# Patient Record
Sex: Male | Born: 1950
Health system: Southern US, Community
[De-identification: ages and names within clinical notes are randomized; demographics above are authoritative.]

## PROBLEM LIST (undated history)

## (undated) DIAGNOSIS — N183 Chronic kidney disease, stage 3 unspecified: Secondary | ICD-10-CM

## (undated) DIAGNOSIS — Z87442 Personal history of urinary calculi: Secondary | ICD-10-CM

## (undated) DIAGNOSIS — K219 Gastro-esophageal reflux disease without esophagitis: Secondary | ICD-10-CM

## (undated) DIAGNOSIS — L97509 Non-pressure chronic ulcer of other part of unspecified foot with unspecified severity: Secondary | ICD-10-CM

## (undated) DIAGNOSIS — N281 Cyst of kidney, acquired: Secondary | ICD-10-CM

## (undated) DIAGNOSIS — E119 Type 2 diabetes mellitus without complications: Secondary | ICD-10-CM

## (undated) DIAGNOSIS — N200 Calculus of kidney: Secondary | ICD-10-CM

## (undated) DIAGNOSIS — I10 Essential (primary) hypertension: Secondary | ICD-10-CM

## (undated) HISTORY — PX: APPENDECTOMY: SHX54

## (undated) HISTORY — PX: COLONOSCOPY: SHX174

---

## 2014-02-21 DIAGNOSIS — Z23 Encounter for immunization: Secondary | ICD-10-CM | POA: Diagnosis not present

## 2014-04-01 DIAGNOSIS — E1122 Type 2 diabetes mellitus with diabetic chronic kidney disease: Secondary | ICD-10-CM | POA: Diagnosis not present

## 2014-04-01 DIAGNOSIS — N183 Chronic kidney disease, stage 3 (moderate): Secondary | ICD-10-CM | POA: Diagnosis not present

## 2014-04-04 DIAGNOSIS — I129 Hypertensive chronic kidney disease with stage 1 through stage 4 chronic kidney disease, or unspecified chronic kidney disease: Secondary | ICD-10-CM | POA: Diagnosis not present

## 2014-04-04 DIAGNOSIS — N183 Chronic kidney disease, stage 3 (moderate): Secondary | ICD-10-CM | POA: Diagnosis not present

## 2014-04-04 DIAGNOSIS — E1122 Type 2 diabetes mellitus with diabetic chronic kidney disease: Secondary | ICD-10-CM | POA: Diagnosis not present

## 2014-04-11 DIAGNOSIS — E1121 Type 2 diabetes mellitus with diabetic nephropathy: Secondary | ICD-10-CM | POA: Diagnosis not present

## 2014-04-11 DIAGNOSIS — I10 Essential (primary) hypertension: Secondary | ICD-10-CM | POA: Diagnosis not present

## 2014-04-11 DIAGNOSIS — E785 Hyperlipidemia, unspecified: Secondary | ICD-10-CM | POA: Diagnosis not present

## 2014-04-11 DIAGNOSIS — N182 Chronic kidney disease, stage 2 (mild): Secondary | ICD-10-CM | POA: Diagnosis not present

## 2014-08-07 DIAGNOSIS — E1121 Type 2 diabetes mellitus with diabetic nephropathy: Secondary | ICD-10-CM | POA: Diagnosis not present

## 2014-08-07 DIAGNOSIS — I10 Essential (primary) hypertension: Secondary | ICD-10-CM | POA: Diagnosis not present

## 2014-08-07 DIAGNOSIS — R682 Dry mouth, unspecified: Secondary | ICD-10-CM | POA: Diagnosis not present

## 2014-11-12 DIAGNOSIS — N183 Chronic kidney disease, stage 3 (moderate): Secondary | ICD-10-CM | POA: Diagnosis not present

## 2014-11-14 DIAGNOSIS — N183 Chronic kidney disease, stage 3 (moderate): Secondary | ICD-10-CM | POA: Diagnosis not present

## 2014-11-14 DIAGNOSIS — I129 Hypertensive chronic kidney disease with stage 1 through stage 4 chronic kidney disease, or unspecified chronic kidney disease: Secondary | ICD-10-CM | POA: Diagnosis not present

## 2014-11-14 DIAGNOSIS — E1122 Type 2 diabetes mellitus with diabetic chronic kidney disease: Secondary | ICD-10-CM | POA: Diagnosis not present

## 2015-01-12 DIAGNOSIS — Z23 Encounter for immunization: Secondary | ICD-10-CM | POA: Diagnosis not present

## 2015-05-26 DIAGNOSIS — E1121 Type 2 diabetes mellitus with diabetic nephropathy: Secondary | ICD-10-CM | POA: Diagnosis not present

## 2015-05-26 DIAGNOSIS — E7800 Pure hypercholesterolemia, unspecified: Secondary | ICD-10-CM | POA: Diagnosis not present

## 2015-05-26 DIAGNOSIS — Z79899 Other long term (current) drug therapy: Secondary | ICD-10-CM | POA: Diagnosis not present

## 2015-05-26 DIAGNOSIS — Z7984 Long term (current) use of oral hypoglycemic drugs: Secondary | ICD-10-CM | POA: Diagnosis not present

## 2015-05-29 ENCOUNTER — Other Ambulatory Visit: Payer: Self-pay | Admitting: Family Medicine

## 2015-05-29 DIAGNOSIS — E7800 Pure hypercholesterolemia, unspecified: Secondary | ICD-10-CM | POA: Diagnosis not present

## 2015-05-29 DIAGNOSIS — Z23 Encounter for immunization: Secondary | ICD-10-CM | POA: Diagnosis not present

## 2015-05-29 DIAGNOSIS — Z136 Encounter for screening for cardiovascular disorders: Secondary | ICD-10-CM

## 2015-05-29 DIAGNOSIS — N182 Chronic kidney disease, stage 2 (mild): Secondary | ICD-10-CM | POA: Diagnosis not present

## 2015-05-29 DIAGNOSIS — I10 Essential (primary) hypertension: Secondary | ICD-10-CM | POA: Diagnosis not present

## 2015-05-29 DIAGNOSIS — E1121 Type 2 diabetes mellitus with diabetic nephropathy: Secondary | ICD-10-CM | POA: Diagnosis not present

## 2015-05-29 DIAGNOSIS — Z0001 Encounter for general adult medical examination with abnormal findings: Secondary | ICD-10-CM | POA: Diagnosis not present

## 2015-06-09 DIAGNOSIS — I129 Hypertensive chronic kidney disease with stage 1 through stage 4 chronic kidney disease, or unspecified chronic kidney disease: Secondary | ICD-10-CM | POA: Diagnosis not present

## 2015-06-09 DIAGNOSIS — N183 Chronic kidney disease, stage 3 (moderate): Secondary | ICD-10-CM | POA: Diagnosis not present

## 2015-06-12 DIAGNOSIS — E1122 Type 2 diabetes mellitus with diabetic chronic kidney disease: Secondary | ICD-10-CM | POA: Diagnosis not present

## 2015-06-12 DIAGNOSIS — I129 Hypertensive chronic kidney disease with stage 1 through stage 4 chronic kidney disease, or unspecified chronic kidney disease: Secondary | ICD-10-CM | POA: Diagnosis not present

## 2015-06-12 DIAGNOSIS — N183 Chronic kidney disease, stage 3 (moderate): Secondary | ICD-10-CM | POA: Diagnosis not present

## 2015-09-18 DIAGNOSIS — D1801 Hemangioma of skin and subcutaneous tissue: Secondary | ICD-10-CM | POA: Diagnosis not present

## 2015-09-18 DIAGNOSIS — D485 Neoplasm of uncertain behavior of skin: Secondary | ICD-10-CM | POA: Diagnosis not present

## 2015-09-18 DIAGNOSIS — L309 Dermatitis, unspecified: Secondary | ICD-10-CM | POA: Diagnosis not present

## 2015-09-18 DIAGNOSIS — D2371 Other benign neoplasm of skin of right lower limb, including hip: Secondary | ICD-10-CM | POA: Diagnosis not present

## 2015-09-18 DIAGNOSIS — D2262 Melanocytic nevi of left upper limb, including shoulder: Secondary | ICD-10-CM | POA: Diagnosis not present

## 2015-09-18 DIAGNOSIS — L7211 Pilar cyst: Secondary | ICD-10-CM | POA: Diagnosis not present

## 2015-09-18 DIAGNOSIS — D225 Melanocytic nevi of trunk: Secondary | ICD-10-CM | POA: Diagnosis not present

## 2015-09-18 DIAGNOSIS — L821 Other seborrheic keratosis: Secondary | ICD-10-CM | POA: Diagnosis not present

## 2015-09-18 DIAGNOSIS — D2271 Melanocytic nevi of right lower limb, including hip: Secondary | ICD-10-CM | POA: Diagnosis not present

## 2015-09-18 DIAGNOSIS — D2261 Melanocytic nevi of right upper limb, including shoulder: Secondary | ICD-10-CM | POA: Diagnosis not present

## 2015-11-06 DIAGNOSIS — Z23 Encounter for immunization: Secondary | ICD-10-CM | POA: Diagnosis not present

## 2015-12-04 DIAGNOSIS — N183 Chronic kidney disease, stage 3 (moderate): Secondary | ICD-10-CM | POA: Diagnosis not present

## 2015-12-11 DIAGNOSIS — E1122 Type 2 diabetes mellitus with diabetic chronic kidney disease: Secondary | ICD-10-CM | POA: Diagnosis not present

## 2015-12-11 DIAGNOSIS — N183 Chronic kidney disease, stage 3 (moderate): Secondary | ICD-10-CM | POA: Diagnosis not present

## 2015-12-11 DIAGNOSIS — I129 Hypertensive chronic kidney disease with stage 1 through stage 4 chronic kidney disease, or unspecified chronic kidney disease: Secondary | ICD-10-CM | POA: Diagnosis not present

## 2016-01-29 DIAGNOSIS — N182 Chronic kidney disease, stage 2 (mild): Secondary | ICD-10-CM | POA: Diagnosis not present

## 2016-01-29 DIAGNOSIS — I10 Essential (primary) hypertension: Secondary | ICD-10-CM | POA: Diagnosis not present

## 2016-01-29 DIAGNOSIS — E7800 Pure hypercholesterolemia, unspecified: Secondary | ICD-10-CM | POA: Diagnosis not present

## 2016-01-29 DIAGNOSIS — E1121 Type 2 diabetes mellitus with diabetic nephropathy: Secondary | ICD-10-CM | POA: Diagnosis not present

## 2016-04-26 DIAGNOSIS — Z7984 Long term (current) use of oral hypoglycemic drugs: Secondary | ICD-10-CM | POA: Diagnosis not present

## 2016-04-26 DIAGNOSIS — E1121 Type 2 diabetes mellitus with diabetic nephropathy: Secondary | ICD-10-CM | POA: Diagnosis not present

## 2016-05-27 DIAGNOSIS — N183 Chronic kidney disease, stage 3 (moderate): Secondary | ICD-10-CM | POA: Diagnosis not present

## 2016-06-10 DIAGNOSIS — I129 Hypertensive chronic kidney disease with stage 1 through stage 4 chronic kidney disease, or unspecified chronic kidney disease: Secondary | ICD-10-CM | POA: Diagnosis not present

## 2016-06-10 DIAGNOSIS — N183 Chronic kidney disease, stage 3 (moderate): Secondary | ICD-10-CM | POA: Diagnosis not present

## 2016-06-10 DIAGNOSIS — E1122 Type 2 diabetes mellitus with diabetic chronic kidney disease: Secondary | ICD-10-CM | POA: Diagnosis not present

## 2016-09-22 ENCOUNTER — Emergency Department (HOSPITAL_COMMUNITY): Payer: Medicare Other

## 2016-09-22 ENCOUNTER — Emergency Department (HOSPITAL_COMMUNITY)
Admission: EM | Admit: 2016-09-22 | Discharge: 2016-09-22 | Disposition: A | Discharge status: Home / Self Care | Payer: Medicare Other | Attending: Emergency Medicine | Admitting: Emergency Medicine

## 2016-09-22 ENCOUNTER — Encounter (HOSPITAL_COMMUNITY): Payer: Self-pay

## 2016-09-22 DIAGNOSIS — N200 Calculus of kidney: Secondary | ICD-10-CM | POA: Insufficient documentation

## 2016-09-22 DIAGNOSIS — E119 Type 2 diabetes mellitus without complications: Secondary | ICD-10-CM | POA: Insufficient documentation

## 2016-09-22 DIAGNOSIS — I10 Essential (primary) hypertension: Secondary | ICD-10-CM | POA: Insufficient documentation

## 2016-09-22 DIAGNOSIS — Z7984 Long term (current) use of oral hypoglycemic drugs: Secondary | ICD-10-CM | POA: Diagnosis not present

## 2016-09-22 DIAGNOSIS — R1032 Left lower quadrant pain: Secondary | ICD-10-CM | POA: Diagnosis present

## 2016-09-22 DIAGNOSIS — Z79899 Other long term (current) drug therapy: Secondary | ICD-10-CM | POA: Insufficient documentation

## 2016-09-22 DIAGNOSIS — Z7982 Long term (current) use of aspirin: Secondary | ICD-10-CM | POA: Insufficient documentation

## 2016-09-22 HISTORY — DX: Essential (primary) hypertension: I10

## 2016-09-22 HISTORY — DX: Type 2 diabetes mellitus without complications: E11.9

## 2016-09-22 LAB — COMPREHENSIVE METABOLIC PANEL
ALT: 18 U/L (ref 17–63)
ANION GAP: 13 (ref 5–15)
AST: 21 U/L (ref 15–41)
Albumin: 4.2 g/dL (ref 3.5–5.0)
Alkaline Phosphatase: 67 U/L (ref 38–126)
BUN: 29 mg/dL — ABNORMAL HIGH (ref 6–20)
CHLORIDE: 96 mmol/L — AB (ref 101–111)
CO2: 25 mmol/L (ref 22–32)
Calcium: 9.5 mg/dL (ref 8.9–10.3)
Creatinine, Ser: 2.08 mg/dL — ABNORMAL HIGH (ref 0.61–1.24)
GFR calc non Af Amer: 31 mL/min — ABNORMAL LOW (ref >60)
GFR, EST AFRICAN AMERICAN: 36 mL/min — AB (ref >60)
Glucose, Bld: 151 mg/dL — ABNORMAL HIGH (ref 65–99)
Potassium: 4.2 mmol/L (ref 3.5–5.1)
SODIUM: 134 mmol/L — AB (ref 135–145)
Total Bilirubin: 1.1 mg/dL (ref 0.3–1.2)
Total Protein: 7.1 g/dL (ref 6.5–8.1)

## 2016-09-22 LAB — DIFFERENTIAL
BASOS ABS: 0 10*3/uL (ref 0.0–0.1)
BASOS PCT: 0 %
EOS ABS: 0 10*3/uL (ref 0.0–0.7)
Eosinophils Relative: 0 %
Lymphocytes Relative: 9 %
Lymphs Abs: 1 10*3/uL (ref 0.7–4.0)
Monocytes Absolute: 1.1 10*3/uL — ABNORMAL HIGH (ref 0.1–1.0)
Monocytes Relative: 10 %
Neutro Abs: 8.7 10*3/uL — ABNORMAL HIGH (ref 1.7–7.7)
Neutrophils Relative %: 81 %

## 2016-09-22 LAB — CBC
HEMATOCRIT: 33.6 % — AB (ref 39.0–52.0)
HEMOGLOBIN: 12.2 g/dL — AB (ref 13.0–17.0)
MCH: 32 pg (ref 26.0–34.0)
MCHC: 36.3 g/dL — ABNORMAL HIGH (ref 30.0–36.0)
MCV: 88.2 fL (ref 78.0–100.0)
Platelets: 215 10*3/uL (ref 150–400)
RBC: 3.81 MIL/uL — AB (ref 4.22–5.81)
RDW: 12 % (ref 11.5–15.5)
WBC: 10.9 10*3/uL — AB (ref 4.0–10.5)

## 2016-09-22 LAB — URINALYSIS, ROUTINE W REFLEX MICROSCOPIC
BACTERIA UA: NONE SEEN
BILIRUBIN URINE: NEGATIVE
Glucose, UA: NEGATIVE mg/dL
KETONES UR: NEGATIVE mg/dL
LEUKOCYTES UA: NEGATIVE
Nitrite: NEGATIVE
PH: 5 (ref 5.0–8.0)
Protein, ur: NEGATIVE mg/dL
Specific Gravity, Urine: 1.011 (ref 1.005–1.030)
Squamous Epithelial / LPF: NONE SEEN

## 2016-09-22 LAB — LIPASE, BLOOD: LIPASE: 16 U/L (ref 11–51)

## 2016-09-22 MED ORDER — ONDANSETRON HCL 4 MG/2ML IJ SOLN
4.0000 mg | Freq: Once | INTRAMUSCULAR | Status: AC
  Administered 2016-09-22: 4 mg via INTRAVENOUS
  Filled 2016-09-22: qty 2

## 2016-09-22 MED ORDER — HYDROMORPHONE HCL 1 MG/ML IJ SOLN
0.5000 mg | Freq: Once | INTRAMUSCULAR | Status: AC
  Administered 2016-09-22: 0.5 mg via INTRAVENOUS
  Filled 2016-09-22: qty 1

## 2016-09-22 MED ORDER — ONDANSETRON HCL 4 MG PO TABS: 4.0000 mg | ORAL_TABLET | Freq: Four times a day (QID) | ORAL | 0 refills | Status: DC

## 2016-09-22 MED ORDER — HYDROCODONE-ACETAMINOPHEN 5-325 MG PO TABS: 1.0000 | ORAL_TABLET | Freq: Four times a day (QID) | ORAL | 0 refills | Status: DC | PRN

## 2016-09-22 NOTE — ED Notes (Signed)
Report given to next RN.

## 2016-09-22 NOTE — ED Provider Notes (Signed)
[name] DEPT Provider Note   CSN: [id number] Arrival date & time: 09/22/16  0556     History   Chief Complaint Chief Complaint  Patient presents with  . Abdominal Pain    HPI Timothy Ray is a 66 y.o. male.  Patient complaining of left lower quadrant cramping and pain some nausea. Patient thinks it might be related to some food he had   The history is provided by the patient. No language interpreter was used.  Abdominal Pain   This is a new problem. The current episode started 3 to 5 hours ago. The problem occurs constantly. The problem has not changed since onset.The pain is associated with an unknown factor. The pain is located in the LLQ. The quality of the pain is aching. The pain is at a severity of 5/10. The pain is moderate. Associated symptoms include vomiting. Pertinent negatives include anorexia, diarrhea, frequency, hematuria and headaches.    Past Medical History:  Diagnosis Date  . Diabetes mellitus without complication ([place])   . Hypertension     There are no active problems to display for this patient.   History reviewed. No pertinent surgical history.     Home Medications    Prior to Admission medications   Medication Sig Start Date End Date Taking? Authorizing Provider  acetaminophen (TYLENOL) 500 MG tablet Take 1,000 mg by mouth every 8 (eight) hours as needed for mild pain, moderate pain, fever or headache.   Yes [provider]  alum & mag hydroxide-simeth (MAALOX/MYLANTA) 200-200-20 MG/5ML suspension Take 30 mLs by mouth once.   Yes [provider]  amLODipine (NORVASC) 10 MG tablet Take 10 mg by mouth daily.   Yes [provider]  aspirin EC 81 MG tablet Take 81 mg by mouth daily.   Yes [provider]  atorvastatin (LIPITOR) 80 MG tablet Take 80 mg by mouth daily.   Yes [provider]  glipiZIDE (GLUCOTROL XL) 10 MG 24 hr tablet Take 10 mg by mouth daily with breakfast. Takes along with the 5 mg  to make a total of 15 mg a day   Yes [provider]  glipiZIDE (GLUCOTROL XL) 5 MG 24 hr tablet Take 5 mg by mouth daily with breakfast. Takes along with the 10 mg to make a total of 15 mg a day   Yes [provider]  hydrochlorothiazide (MICROZIDE) 12.5 MG capsule Take 12.5 mg by mouth daily.   Yes [provider]  losartan (COZAAR) 25 MG tablet Take 25 mg by mouth daily.   Yes [provider]  metFORMIN (GLUCOPHAGE) 1000 MG tablet Take 1,000 mg by mouth 2 (two) times daily. 09/13/16  Yes [provider]  modafinil (PROVIGIL) 200 MG tablet Take 100-200 mg by mouth daily as needed (for fatigue).   Yes [provider]  nebivolol (BYSTOLIC) 10 MG tablet Take 10 mg by mouth daily.   Yes [provider]  omeprazole (PRILOSEC OTC) 20 MG tablet Take 20 mg by mouth daily as needed (for acid reflex).   Yes [provider]  Vitamin D, Ergocalciferol, (DRISDOL) 50000 units CAPS capsule Take 50,000 Units by mouth every 14 (fourteen) days.   Yes [provider]    Family History History reviewed. No pertinent family history.  Social History Social History  Substance Use Topics  . Smoking status: Never Smoker  . Smokeless tobacco: Never Used  . Alcohol use Yes     Allergies   Penicillins   Review  of Systems Review of Systems  Constitutional: Negative for appetite change and fatigue.  HENT: Negative for congestion, ear discharge and sinus pressure.   Eyes: Negative for discharge.  Respiratory: Negative for cough.   Cardiovascular: Negative for chest pain.  Gastrointestinal: Positive for abdominal pain and vomiting. Negative for anorexia and diarrhea.  Genitourinary: Negative for frequency and hematuria.  Musculoskeletal: Negative for back pain.  Skin: Negative for rash.  Neurological: Negative for seizures and headaches.  Psychiatric/Behavioral: Negative for hallucinations.     Physical Exam Updated Vital  Signs BP (!) 143/61   Pulse 69   Temp 98.5 F (36.9 C) (Oral)   Resp 16   SpO2 95%   Physical Exam  Constitutional: He is oriented to person, place, and time. He appears well-developed.  HENT:  Head: Normocephalic.  Eyes: Conjunctivae and EOM are normal. No scleral icterus.  Neck: Neck supple. No thyromegaly present.  Cardiovascular: Normal rate and regular rhythm.  Exam reveals no gallop and no friction rub.   No murmur heard. Pulmonary/Chest: No stridor. He has no wheezes. He has no rales. He exhibits no tenderness.  Abdominal: He exhibits no distension. There is tenderness. There is no rebound.  Tender left lower quadrant  Musculoskeletal: Normal range of motion. He exhibits no edema.  Lymphadenopathy:    He has no cervical adenopathy.  Neurological: He is oriented to person, place, and time. He exhibits normal muscle tone. Coordination normal.  Skin: No rash noted. No erythema.  Psychiatric: He has a normal mood and affect. His behavior is normal.     ED Treatments / Results  Labs (all labs ordered are listed, but only abnormal results are displayed) Labs Reviewed  COMPREHENSIVE METABOLIC PANEL - Abnormal; Notable for the following:       Result Value   Sodium 134 (*)    Chloride 96 (*)    Glucose, Bld 151 (*)    BUN 29 (*)    Creatinine, Ser 2.08 (*)    GFR calc non Af Amer 31 (*)    GFR calc Af Amer 36 (*)    All other components within normal limits  CBC - Abnormal; Notable for the following:    WBC 10.9 (*)    RBC 3.81 (*)    Hemoglobin 12.2 (*)    HCT 33.6 (*)    MCHC 36.3 (*)    All other components within normal limits  URINALYSIS, ROUTINE W REFLEX MICROSCOPIC - Abnormal; Notable for the following:    Color, Urine STRAW (*)    Hgb urine dipstick MODERATE (*)    All other components within normal limits  DIFFERENTIAL - Abnormal; Notable for the following:    Neutro Abs 8.7 (*)    Monocytes Absolute 1.1 (*)    All other components within normal limits    LIPASE, BLOOD    EKG  EKG Interpretation None       Radiology Ct Abdomen Pelvis Wo Contrast  Result Date: 09/22/2016 CLINICAL DATA:  Abdominal pain for several days, initial encounter EXAM: CT ABDOMEN AND PELVIS WITHOUT CONTRAST TECHNIQUE: Multidetector CT imaging of the abdomen and pelvis was performed following the standard protocol without IV contrast. COMPARISON:  None. FINDINGS: Lower chest: Lung bases demonstrate some mild atelectatic changes without focal confluent infiltrate. Hepatobiliary: No focal liver abnormality is seen. No gallstones, gallbladder wall thickening, or biliary dilatation. Pancreas: Unremarkable. No pancreatic ductal dilatation or surrounding inflammatory changes. Spleen: Normal in size without focal abnormality. Adrenals/Urinary Tract: The adrenal glands are  within normal limits. The kidneys are well visualized bilaterally. Large 1.9 cm left renal pelvic stone is noted with mild obstructive change. Scattered cysts are seen within the left kidney. The largest of these measures approximately 3.3 cm in greatest dimension. The ureters are within normal limits bilaterally. The bladder is well distended. Stomach/Bowel: The appendix is not well visualized although no inflammatory changes are seen. No obstructive changes are noted. Ingested tablets are seen in the cecum. Vascular/Lymphatic: Aortic atherosclerosis. No enlarged abdominal or pelvic lymph nodes. Reproductive: Prostate is unremarkable. Other: No abdominal wall hernia or abnormality. No abdominopelvic ascites. Musculoskeletal: Degenerative changes of the lumbar spine are noted. IMPRESSION: 1.9 cm left renal pelvic stone with mild obstructive change. Left renal cysts. No other focal abnormality is noted. Electronically Signed   By: Inez Catalina M.D.   On: 09/22/2016 10:31    Procedures Procedures (including critical care time)  Medications Ordered in ED Medications  HYDROmorphone (DILAUDID) injection 0.5 mg (0.5  mg Intravenous Given 09/22/16 0813)  ondansetron (ZOFRAN) injection 4 mg (4 mg Intravenous Given 09/22/16 0211)     Initial Impression / Assessment and Plan / ED Course  I have reviewed the triage vital signs and the nursing notes.  Pertinent labs & imaging results that were available during my care of the patient were reviewed by me and considered in my medical decision making (see chart for details).     Patient has a large kidney stone in the left renal pelvis. He will be given pain medicine nausea medicine and referred to urology  Final Clinical Impressions(s) / ED Diagnoses   Final diagnoses:  Kidney stone    New Prescriptions New Prescriptions   No medications on file     Milton Ferguson, MD 09/22/16 1245

## 2016-09-22 NOTE — ED Triage Notes (Signed)
Pt complains of abdominal pain for two days, he states he feels bloated and nauseated Pt thinks he ate bad pasta sauce and an hour later his stomach started hurting

## 2016-09-22 NOTE — ED Notes (Signed)
ED Provider at bedside. 

## 2016-09-22 NOTE — Discharge Instructions (Signed)
Follow up with Dr. Junious Silk or one of his partners at [facility] urology next week.  Return if problems

## 2016-09-23 DIAGNOSIS — N183 Chronic kidney disease, stage 3 (moderate): Secondary | ICD-10-CM | POA: Diagnosis not present

## 2016-09-23 DIAGNOSIS — N200 Calculus of kidney: Secondary | ICD-10-CM | POA: Diagnosis not present

## 2016-09-26 ENCOUNTER — Other Ambulatory Visit: Payer: Self-pay | Admitting: Urology

## 2016-09-26 DIAGNOSIS — N201 Calculus of ureter: Secondary | ICD-10-CM

## 2016-09-30 NOTE — Patient Instructions (Addendum)
Timothy Ray  09/30/2016   Your procedure is scheduled on: Thursday 10/13/2016  Report to [facility] Main  Entrance and follow the signs to Radiology on the first floor  @ 0700am     Call this number if you have problems the morning of surgery [phone]   Remember: ONLY 1 PERSON MAY GO WITH YOU TO SHORT STAY TO GET  [place].   Do not eat food or drink liquids :After Midnight.    How to Manage Your Diabetes Before and After Surgery  Why is it important to control my blood sugar before and after surgery? . Improving blood sugar levels before and after surgery helps healing and can limit problems. . A way of improving blood sugar control is eating a healthy diet by: o  Eating less sugar and carbohydrates o  Increasing activity/exercise o  Talking with your doctor about reaching your blood sugar goals . High blood sugars (greater than 180 mg/dL) can raise your risk of infections and slow your recovery, so you will need to focus on controlling your diabetes during the weeks before surgery. . Make sure that the doctor who takes care of your diabetes knows about your planned surgery including the date and location.  How do I manage my blood sugar before surgery? . Check your blood sugar at least 4 times a day, starting 2 days before surgery, to make sure that the level is not too high or low. o Check your blood sugar the morning of your surgery when you wake up and every 2 hours until you get to the Short Stay unit. . If your blood sugar is less than 70 mg/dL, you will need to treat for low blood sugar: o Do not take insulin. o Treat a low blood sugar (less than 70 mg/dL) with  cup of clear juice (cranberry or apple), 4 glucose tablets, OR glucose gel. o Recheck blood sugar in 15 minutes after treatment (to make sure it is greater than 70 mg/dL). If your blood sugar is not greater than 70 mg/dL on recheck, call [phone] for  further instructions. . Report your blood sugar to the short stay nurse when you get to Short Stay.  . If you are admitted to the hospital after surgery: o Your blood sugar will be checked by the staff and you will probably be given insulin after surgery (instead of oral diabetes medicines) to make sure you have good blood sugar levels. o The goal for blood sugar control after surgery is 80-180 mg/dL.   WHAT DO I DO ABOUT MY DIABETES MEDICATION?                DO NOT TAKE ANY DIABETIC MEDICATIONS MORNING OF SURGERY!  TAKE THESE MEDICATIONS MORNING OF SURGERY WITH A SIP OF WATER: Amlodipine, Bystolic                                You may not have any metal on your body including hair pins and              piercings  Do not wear jewelry, lotions, powders or perfumes, deodorant                      Men may shave face and neck.   Do not  bring valuables to the hospital. [place].  Contacts, dentures or bridgework may not be worn into surgery.  Leave suitcase in the car. After surgery it may be brought to your room.                Please read over the following fact sheets you were given: _____________________________________________________________________           [facility] - Preparing for Surgery Before surgery, you can play an important role.  Because skin is not sterile, your skin needs to be as free of germs as possible.  You can reduce the number of germs on your skin by washing with CHG (chlorahexidine gluconate) soap before surgery.  CHG is an antiseptic cleaner which kills germs and bonds with the skin to continue killing germs even after washing. Please DO NOT use if you have an allergy to CHG or antibacterial soaps.  If your skin becomes reddened/irritated stop using the CHG and inform your nurse when you arrive at Short Stay. Do not shave (including legs and underarms) for at least 48 hours prior to the first CHG shower.   You may shave your face/neck. Please follow these instructions carefully:  1.  Shower with CHG Soap the night before surgery and the  morning of Surgery.  2.  If you choose to wash your hair, wash your hair first as usual with your  normal  shampoo.  3.  After you shampoo, rinse your hair and body thoroughly to remove the  shampoo.                           4.  Use CHG as you would any other liquid soap.  You can apply chg directly  to the skin and wash                       Gently with a scrungie or clean washcloth.  5.  Apply the CHG Soap to your body ONLY FROM THE NECK DOWN.   Do not use on face/ open                           Wound or open sores. Avoid contact with eyes, ears mouth and genitals (private parts).                       Wash face,  Genitals (private parts) with your normal soap.             6.  Wash thoroughly, paying special attention to the area where your surgery  will be performed.  7.  Thoroughly rinse your body with warm water from the neck down.  8.  DO NOT shower/wash with your normal soap after using and rinsing off  the CHG Soap.                9.  Pat yourself dry with a clean towel.            10.  Wear clean pajamas.            11.  Place clean sheets on your bed the night of your first shower and do not  sleep with pets. Day of Surgery : Do not apply any lotions/deodorants the morning of surgery.  Please wear clean clothes to the hospital/surgery center.  FAILURE TO FOLLOW THESE INSTRUCTIONS MAY RESULT IN THE CANCELLATION OF YOUR SURGERY PATIENT SIGNATURE_________________________________  NURSE SIGNATURE__________________________________  ________________________________________________________________________

## 2016-10-03 ENCOUNTER — Encounter (HOSPITAL_COMMUNITY): Payer: Self-pay

## 2016-10-03 ENCOUNTER — Encounter (HOSPITAL_COMMUNITY)
Admission: RE | Admit: 2016-10-03 | Discharge: 2016-10-03 | Disposition: A | Discharge status: Home / Self Care | Payer: Medicare Other | Source: Ambulatory Visit | Attending: Urology | Admitting: Urology

## 2016-10-03 DIAGNOSIS — Z01812 Encounter for preprocedural laboratory examination: Secondary | ICD-10-CM | POA: Diagnosis not present

## 2016-10-03 DIAGNOSIS — N201 Calculus of ureter: Secondary | ICD-10-CM | POA: Diagnosis not present

## 2016-10-03 DIAGNOSIS — Z01818 Encounter for other preprocedural examination: Secondary | ICD-10-CM | POA: Diagnosis not present

## 2016-10-03 HISTORY — DX: Personal history of urinary calculi: Z87.442

## 2016-10-03 LAB — GLUCOSE, CAPILLARY: GLUCOSE-CAPILLARY: 149 mg/dL — AB (ref 65–99)

## 2016-10-03 NOTE — Progress Notes (Signed)
09/22/16 lipase cmp, cbc/diff, ua epic 09/22/16 CT abd Jerene Pitch epic

## 2016-10-04 NOTE — Progress Notes (Signed)
Final EKG done 10/03/16-epic.

## 2016-10-11 ENCOUNTER — Other Ambulatory Visit: Payer: Self-pay | Admitting: Radiology

## 2016-10-12 ENCOUNTER — Other Ambulatory Visit: Payer: Self-pay | Admitting: Radiology

## 2016-10-12 NOTE — H&P (Signed)
CC: I have kidney stones.  HPI: Timothy Ray is a 66 year-old male patient who was referred by Timothy Ray who is here for renal calculi.  The problem is on the left side. He first stated noticing pain on approximately 09/20/2016. This is his first kidney stone. He is not currently having flank pain, back pain, groin pain, nausea, vomiting, fever or chills. He has not caught a stone in his urine strainer since his symptoms began.   He has never had surgical treatment for calculi in the past.   Timothy Ray is a 66 yo WM who was seen in the ER yesterday for bloating, left flank pain. The pain was moderate to severe. He had nausea but no vomiting. He has had no hematuria. He is feeling better today but he has been taking hydrocodone and tylenol. He had a CT that shows a 77mm left renal stone with some obstruction. In retrospect he has had some intermittent left low back pain. His Cr was 2.08 and he has had mild CRI and has seen nephrology in the past. The stone is about 400 HU but he has had no prior history of gout.      ALLERGIES: Penicillin - Skin Rash    MEDICATIONS: Hydrochlorothiazide 12.5 mg capsule  Metformin Hcl 1,000 mg tablet  Amlodipine Besylate 10 mg tablet  Atorvastatin Calcium 80 mg tablet  Bystolic 10 mg tablet  Glipizide Xl  Losartan Potassium 25 mg tablet  Vitamin D     GU PSH: None   NON-GU PSH: Appendectomy (open)    GU PMH: Chronic kidney disease stage 3 (GFR 30-60)    NON-GU PMH: Diabetes Type 2 Hypercholesterolemia Hypertension    FAMILY HISTORY: Diabetes - Father Heart Attack - Mother Hypertension - Mother stroke - Mother   SOCIAL HISTORY: Marital Status: Married Preferred Language: English; Race: White Current Smoking Status: Patient does not smoke anymore.   Tobacco Use Assessment Completed: Used Tobacco in last 30 days? Drinks 2 drinks per day.  Drinks 2 caffeinated drinks per day.    REVIEW OF SYSTEMS:    GU Review Male:   Patient reports  frequent urination and erection problems. Patient denies hard to postpone urination, burning/ pain with urination, get up at night to urinate, leakage of urine, stream starts and stops, trouble starting your stream, have to strain to urinate , and penile pain.  Gastrointestinal (Upper):   Patient denies nausea, vomiting, and indigestion/ heartburn.  Gastrointestinal (Lower):   Patient denies diarrhea and constipation.  Constitutional:   Patient denies fever, night sweats, weight loss, and fatigue.  Skin:   Patient denies skin rash/ lesion and itching.  Eyes:   Patient denies blurred vision and double vision.  Ears/ Nose/ Throat:   Patient denies sore throat and sinus problems.  Hematologic/Lymphatic:   Patient denies swollen glands and easy bruising.  Cardiovascular:   Patient denies leg swelling and chest pains.  Respiratory:   Patient denies cough and shortness of breath.  Endocrine:   Patient denies excessive thirst.  Musculoskeletal:   Patient denies back pain and joint pain.  Neurological:   Patient denies headaches and dizziness.  Psychologic:   Patient denies depression and anxiety.   VITAL SIGNS:      09/23/2016 01:04 PM  Weight 195 lb / 88.45 kg  Height 69 in / 175.26 cm  BP 125/66 mmHg  Heart Rate 74 /min  Temperature 98.3 F / 36.8 C  BMI 28.8 kg/m   MULTI-SYSTEM PHYSICAL EXAMINATION:  Constitutional: Well-nourished. No physical deformities. Normally developed. Good grooming.  Neck: Neck symmetrical, not swollen. Normal tracheal position.  Respiratory: No labored breathing, no use of accessory muscles. CTA  Cardiovascular: Normal temperature, RRR without murmur  Lymphatic: No enlargement, no tenderness of supraclavicular and neck lymph nodes.  Skin: No paleness, no jaundice, no cyanosis. No lesion, no ulcer, no rash.  Neurologic / Psychiatric: Oriented to time, oriented to place, oriented to person. No depression, no anxiety, no agitation.  Gastrointestinal: No mass, no  tenderness, no rigidity, non obese abdomen.  Musculoskeletal: Normal gait and station of head and neck.     PAST DATA REVIEWED:  Source Of History:  Patient  Lab Test Review:   CMP  Records Review:   Previous Hospital Records  Urine Test Review:   Urinalysis  X-Ray Review: [facility]. [facility] Protocol: Reviewed Films. Reviewed Report. Discussed With Patient.    Notes:                     Cr was 1.35 in 2017.    PROCEDURES:         KUB - [id number]  A single view of the abdomen is obtained. He has a shadow over the area of the left renal pelvis that is 20x47mm and is consistent with the stone but it is faint. He has no bone, gas or soft tissue findings of concern.                Urinalysis w/Scope Dipstick Dipstick Cont'd Micro  Color: Yellow Bilirubin: Neg WBC/hpf: 0 - 5/hpf  Appearance: Clear Ketones: Neg RBC/hpf: 0 - 2/hpf  Specific Gravity: 1.020 Blood: 2+ Bacteria: Few (10-25/hpf)  pH: 5.5 Protein: 1+ Cystals: NS (Not Seen)  Glucose: Neg Urobilinogen: 0.2 Casts: NS (Not Seen)    Nitrites: Neg Trichomonas: Not Present    Leukocyte Esterase: Neg Mucous: Not Present      Epithelial Cells: NS (Not Seen)      Yeast: NS (Not Seen)      Sperm: Not Present    ASSESSMENT:      ICD-10 Details  1 GU:   Renal calculus - N20.0 He has a 1.9cm stone in the left renal pelvis that is most consistent with uric acid. His pain is controlled but he had acute on chronic renal insufficiency. I reviewed the options for treatment including ESWL, URS and PCNL and based on the size and radiolucency, I believe that PCNL will be the most likely to render him stone free in a single procedure. I reviewed the risks of bleeding, infection, renal injury with urine leakage and possible need for transfusion, injury to adjacent organs, need for secondary procedures, thrombotic events and anesthetic complicaitons. I don't think he is a candidate for medical dissolution therapy because of the renal insufficiency. I will try to  get him set up in the next couple of weeks. He has some conflicts but if his symptoms worsen, we could go ahead and have the perc tube placed.   2   Chronic kidney disease stage 3 (GFR 30-60) - N18.3 Worsening   PLAN:           Orders X-Rays: KUB          Schedule Return Visit/Planned Activity: ASAP - Schedule Surgery             Note: Will need a left PCNL.

## 2016-10-13 ENCOUNTER — Encounter (HOSPITAL_COMMUNITY): Payer: Self-pay

## 2016-10-13 ENCOUNTER — Observation Stay (HOSPITAL_COMMUNITY)
Admission: RE | Admit: 2016-10-13 | Discharge: 2016-10-14 | Disposition: A | Discharge status: Home / Self Care | Payer: Medicare Other | Source: Ambulatory Visit | Attending: Urology | Admitting: Urology

## 2016-10-13 ENCOUNTER — Encounter (HOSPITAL_COMMUNITY): Payer: Self-pay | Admitting: *Deleted

## 2016-10-13 ENCOUNTER — Ambulatory Visit (HOSPITAL_COMMUNITY): Payer: Medicare Other | Admitting: Anesthesiology

## 2016-10-13 ENCOUNTER — Encounter (HOSPITAL_COMMUNITY)
Admission: RE | Disposition: A | Discharge status: Home / Self Care | Payer: Self-pay | Source: Ambulatory Visit | Attending: Urology

## 2016-10-13 ENCOUNTER — Ambulatory Visit (HOSPITAL_COMMUNITY)
Admission: RE | Admit: 2016-10-13 | Discharge: 2016-10-13 | Disposition: A | Discharge status: Home / Self Care | Payer: Medicare Other | Source: Ambulatory Visit | Attending: Urology | Admitting: Urology

## 2016-10-13 ENCOUNTER — Ambulatory Visit (HOSPITAL_COMMUNITY): Payer: Medicare Other

## 2016-10-13 DIAGNOSIS — N289 Disorder of kidney and ureter, unspecified: Secondary | ICD-10-CM | POA: Insufficient documentation

## 2016-10-13 DIAGNOSIS — Z87891 Personal history of nicotine dependence: Secondary | ICD-10-CM | POA: Insufficient documentation

## 2016-10-13 DIAGNOSIS — Z7984 Long term (current) use of oral hypoglycemic drugs: Secondary | ICD-10-CM | POA: Diagnosis not present

## 2016-10-13 DIAGNOSIS — N183 Chronic kidney disease, stage 3 (moderate): Secondary | ICD-10-CM | POA: Insufficient documentation

## 2016-10-13 DIAGNOSIS — E119 Type 2 diabetes mellitus without complications: Secondary | ICD-10-CM | POA: Diagnosis not present

## 2016-10-13 DIAGNOSIS — E1122 Type 2 diabetes mellitus with diabetic chronic kidney disease: Secondary | ICD-10-CM | POA: Insufficient documentation

## 2016-10-13 DIAGNOSIS — I129 Hypertensive chronic kidney disease with stage 1 through stage 4 chronic kidney disease, or unspecified chronic kidney disease: Secondary | ICD-10-CM | POA: Diagnosis not present

## 2016-10-13 DIAGNOSIS — N200 Calculus of kidney: Secondary | ICD-10-CM | POA: Diagnosis not present

## 2016-10-13 DIAGNOSIS — E785 Hyperlipidemia, unspecified: Secondary | ICD-10-CM | POA: Diagnosis not present

## 2016-10-13 DIAGNOSIS — Z79899 Other long term (current) drug therapy: Secondary | ICD-10-CM | POA: Diagnosis not present

## 2016-10-13 DIAGNOSIS — I10 Essential (primary) hypertension: Secondary | ICD-10-CM | POA: Diagnosis not present

## 2016-10-13 DIAGNOSIS — N132 Hydronephrosis with renal and ureteral calculous obstruction: Principal | ICD-10-CM | POA: Insufficient documentation

## 2016-10-13 DIAGNOSIS — E7800 Pure hypercholesterolemia, unspecified: Secondary | ICD-10-CM | POA: Diagnosis not present

## 2016-10-13 DIAGNOSIS — N201 Calculus of ureter: Secondary | ICD-10-CM

## 2016-10-13 DIAGNOSIS — D649 Anemia, unspecified: Secondary | ICD-10-CM | POA: Diagnosis not present

## 2016-10-13 HISTORY — PX: IR URETERAL STENT LEFT NEW ACCESS W/O SEP NEPHROSTOMY CATH: IMG6075

## 2016-10-13 HISTORY — PX: NEPHROLITHOTOMY: SHX5134

## 2016-10-13 LAB — BASIC METABOLIC PANEL
Anion gap: 13 (ref 5–15)
BUN: 39 mg/dL — AB (ref 6–20)
CO2: 21 mmol/L — ABNORMAL LOW (ref 22–32)
Calcium: 9.9 mg/dL (ref 8.9–10.3)
Chloride: 103 mmol/L (ref 101–111)
Creatinine, Ser: 2.43 mg/dL — ABNORMAL HIGH (ref 0.61–1.24)
GFR, EST AFRICAN AMERICAN: 30 mL/min — AB (ref >60)
GFR, EST NON AFRICAN AMERICAN: 26 mL/min — AB (ref >60)
Glucose, Bld: 151 mg/dL — ABNORMAL HIGH (ref 65–99)
POTASSIUM: 3.8 mmol/L (ref 3.5–5.1)
SODIUM: 137 mmol/L (ref 135–145)

## 2016-10-13 LAB — CBC WITH DIFFERENTIAL/PLATELET
BASOS PCT: 1 %
Basophils Absolute: 0 10*3/uL (ref 0.0–0.1)
EOS ABS: 0.3 10*3/uL (ref 0.0–0.7)
EOS PCT: 4 %
HCT: 32.8 % — ABNORMAL LOW (ref 39.0–52.0)
Hemoglobin: 11.6 g/dL — ABNORMAL LOW (ref 13.0–17.0)
Lymphocytes Relative: 16 %
Lymphs Abs: 1.2 10*3/uL (ref 0.7–4.0)
MCH: 31.8 pg (ref 26.0–34.0)
MCHC: 35.4 g/dL (ref 30.0–36.0)
MCV: 89.9 fL (ref 78.0–100.0)
Monocytes Absolute: 0.6 10*3/uL (ref 0.1–1.0)
Monocytes Relative: 8 %
Neutro Abs: 5.3 10*3/uL (ref 1.7–7.7)
Neutrophils Relative %: 71 %
PLATELETS: 376 10*3/uL (ref 150–400)
RBC: 3.65 MIL/uL — AB (ref 4.22–5.81)
RDW: 11.8 % (ref 11.5–15.5)
WBC: 7.5 10*3/uL (ref 4.0–10.5)

## 2016-10-13 LAB — GLUCOSE, CAPILLARY
Glucose-Capillary: 128 mg/dL — ABNORMAL HIGH (ref 65–99)
Glucose-Capillary: 135 mg/dL — ABNORMAL HIGH (ref 65–99)
Glucose-Capillary: 161 mg/dL — ABNORMAL HIGH (ref 65–99)

## 2016-10-13 LAB — PROTIME-INR
INR: 1.01
PROTHROMBIN TIME: 13.2 s (ref 11.4–15.2)

## 2016-10-13 SURGERY — NEPHROLITHOTOMY PERCUTANEOUS
Anesthesia: General | Laterality: Left

## 2016-10-13 MED ORDER — LOSARTAN POTASSIUM 25 MG PO TABS: 25.0000 mg | ORAL_TABLET | Freq: Every day | ORAL | Status: DC

## 2016-10-13 MED ORDER — HYDROCHLOROTHIAZIDE 12.5 MG PO CAPS: 12.5000 mg | ORAL_CAPSULE | Freq: Every day | ORAL | Status: DC

## 2016-10-13 MED ORDER — LIDOCAINE HCL (PF) 1 % IJ SOLN
INTRAMUSCULAR | Status: DC | PRN
  Administered 2016-10-13: 5 mL via INTRADERMAL

## 2016-10-13 MED ORDER — OMEPRAZOLE MAGNESIUM 20 MG PO TBEC: 20.0000 mg | DELAYED_RELEASE_TABLET | Freq: Every day | ORAL | Status: DC | PRN

## 2016-10-13 MED ORDER — IOHEXOL 300 MG/ML  SOLN
INTRAMUSCULAR | Status: DC | PRN
  Administered 2016-10-13: 13:00:00

## 2016-10-13 MED ORDER — FLEET ENEMA 7-19 GM/118ML RE ENEM: 1.0000 | ENEMA | Freq: Once | RECTAL | Status: DC | PRN

## 2016-10-13 MED ORDER — HYDROMORPHONE HCL-NACL 0.5-0.9 MG/ML-% IV SOSY
0.2500 mg | PREFILLED_SYRINGE | INTRAVENOUS | Status: DC | PRN
  Administered 2016-10-13: 0.5 mg via INTRAVENOUS

## 2016-10-13 MED ORDER — LACTATED RINGERS IV SOLN
INTRAVENOUS | Status: DC
  Administered 2016-10-13: 1000 mL via INTRAVENOUS

## 2016-10-13 MED ORDER — IOPAMIDOL (ISOVUE-300) INJECTION 61%
50.0000 mL | Freq: Once | INTRAVENOUS | Status: AC | PRN
  Administered 2016-10-13: 10 mL

## 2016-10-13 MED ORDER — FENTANYL CITRATE (PF) 100 MCG/2ML IJ SOLN
INTRAMUSCULAR | Status: DC | PRN
  Administered 2016-10-13: 100 ug via INTRAVENOUS

## 2016-10-13 MED ORDER — IOPAMIDOL (ISOVUE-300) INJECTION 61%
INTRAVENOUS | Status: AC
  Administered 2016-10-13: 10 mL
  Filled 2016-10-13: qty 50

## 2016-10-13 MED ORDER — BISACODYL 10 MG RE SUPP: 10.0000 mg | Freq: Every day | RECTAL | Status: DC | PRN

## 2016-10-13 MED ORDER — ROCURONIUM BROMIDE 50 MG/5ML IV SOSY
PREFILLED_SYRINGE | INTRAVENOUS | Status: AC
  Filled 2016-10-13: qty 5

## 2016-10-13 MED ORDER — DOCUSATE SODIUM 100 MG PO CAPS
100.0000 mg | ORAL_CAPSULE | Freq: Two times a day (BID) | ORAL | Status: DC
  Administered 2016-10-13: 100 mg via ORAL
  Filled 2016-10-13: qty 1

## 2016-10-13 MED ORDER — FENTANYL CITRATE (PF) 100 MCG/2ML IJ SOLN
INTRAMUSCULAR | Status: AC
  Filled 2016-10-13: qty 4

## 2016-10-13 MED ORDER — ONDANSETRON HCL 4 MG/2ML IJ SOLN: 4.0000 mg | INTRAMUSCULAR | Status: DC | PRN

## 2016-10-13 MED ORDER — HYDROMORPHONE HCL-NACL 0.5-0.9 MG/ML-% IV SOSY
PREFILLED_SYRINGE | INTRAVENOUS | Status: AC
  Administered 2016-10-13: 0.5 mg
  Filled 2016-10-13: qty 2

## 2016-10-13 MED ORDER — DEXAMETHASONE SODIUM PHOSPHATE 10 MG/ML IJ SOLN
INTRAMUSCULAR | Status: DC | PRN
  Administered 2016-10-13: 10 mg via INTRAVENOUS

## 2016-10-13 MED ORDER — SUGAMMADEX SODIUM 200 MG/2ML IV SOLN
INTRAVENOUS | Status: AC
  Filled 2016-10-13: qty 2

## 2016-10-13 MED ORDER — HYOSCYAMINE SULFATE 0.125 MG SL SUBL
0.1250 mg | SUBLINGUAL_TABLET | SUBLINGUAL | Status: DC | PRN
  Administered 2016-10-13 – 2016-10-14 (×5): 0.125 mg via SUBLINGUAL
  Filled 2016-10-13 (×6): qty 1

## 2016-10-13 MED ORDER — SODIUM CHLORIDE 0.9 % IV SOLN
INTRAVENOUS | Status: DC
  Administered 2016-10-13: 08:00:00 via INTRAVENOUS

## 2016-10-13 MED ORDER — GLIPIZIDE ER 5 MG PO TB24
5.0000 mg | ORAL_TABLET | Freq: Every day | ORAL | Status: DC
  Administered 2016-10-14: 5 mg via ORAL
  Filled 2016-10-13: qty 1

## 2016-10-13 MED ORDER — ACETAMINOPHEN 325 MG PO TABS
650.0000 mg | ORAL_TABLET | ORAL | Status: DC | PRN
  Administered 2016-10-14 (×2): 650 mg via ORAL
  Filled 2016-10-13 (×2): qty 2

## 2016-10-13 MED ORDER — NEBIVOLOL HCL 10 MG PO TABS
10.0000 mg | ORAL_TABLET | Freq: Every day | ORAL | Status: DC
Start: 2016-10-14 — End: 2016-10-14
  Filled 2016-10-13: qty 1

## 2016-10-13 MED ORDER — OXYCODONE HCL 5 MG/5ML PO SOLN
5.0000 mg | Freq: Once | ORAL | Status: DC | PRN
  Filled 2016-10-13: qty 5

## 2016-10-13 MED ORDER — GLIPIZIDE ER 10 MG PO TB24
10.0000 mg | ORAL_TABLET | Freq: Every day | ORAL | Status: DC
Start: 2016-10-14 — End: 2016-10-14
  Administered 2016-10-14: 10 mg via ORAL
  Filled 2016-10-13: qty 1

## 2016-10-13 MED ORDER — MODAFINIL 100 MG PO TABS: 100.0000 mg | ORAL_TABLET | Freq: Every day | ORAL | Status: DC | PRN

## 2016-10-13 MED ORDER — MIDAZOLAM HCL 2 MG/2ML IJ SOLN
INTRAMUSCULAR | Status: AC
  Filled 2016-10-13: qty 6

## 2016-10-13 MED ORDER — ZOLPIDEM TARTRATE 5 MG PO TABS: 5.0000 mg | ORAL_TABLET | Freq: Every evening | ORAL | Status: DC | PRN

## 2016-10-13 MED ORDER — DIPHENHYDRAMINE HCL 50 MG/ML IJ SOLN: 12.5000 mg | Freq: Four times a day (QID) | INTRAMUSCULAR | Status: DC | PRN

## 2016-10-13 MED ORDER — CIPROFLOXACIN IN D5W 400 MG/200ML IV SOLN: 400.0000 mg | INTRAVENOUS | Status: DC

## 2016-10-13 MED ORDER — ROCURONIUM BROMIDE 100 MG/10ML IV SOLN
INTRAVENOUS | Status: DC | PRN
  Administered 2016-10-13: 50 mg via INTRAVENOUS

## 2016-10-13 MED ORDER — MIDAZOLAM HCL 2 MG/2ML IJ SOLN
INTRAMUSCULAR | Status: AC | PRN
  Administered 2016-10-13: 1 mg via INTRAVENOUS
  Administered 2016-10-13: 0.5 mg via INTRAVENOUS
  Administered 2016-10-13: 1 mg via INTRAVENOUS
  Administered 2016-10-13: 0.5 mg via INTRAVENOUS

## 2016-10-13 MED ORDER — ONDANSETRON HCL 4 MG/2ML IJ SOLN
INTRAMUSCULAR | Status: AC
  Filled 2016-10-13: qty 2

## 2016-10-13 MED ORDER — DIPHENHYDRAMINE HCL 12.5 MG/5ML PO ELIX: 12.5000 mg | ORAL_SOLUTION | Freq: Four times a day (QID) | ORAL | Status: DC | PRN

## 2016-10-13 MED ORDER — LIDOCAINE HCL (CARDIAC) 20 MG/ML IV SOLN
INTRAVENOUS | Status: DC | PRN
  Administered 2016-10-13: 50 mg via INTRAVENOUS

## 2016-10-13 MED ORDER — FENTANYL CITRATE (PF) 100 MCG/2ML IJ SOLN
INTRAMUSCULAR | Status: AC
  Filled 2016-10-13: qty 2

## 2016-10-13 MED ORDER — SODIUM CHLORIDE 0.9 % IR SOLN
Status: DC | PRN
  Administered 2016-10-13: 9000 mL

## 2016-10-13 MED ORDER — PANTOPRAZOLE SODIUM 40 MG PO TBEC: 40.0000 mg | DELAYED_RELEASE_TABLET | Freq: Every day | ORAL | Status: DC

## 2016-10-13 MED ORDER — LIDOCAINE HCL (PF) 1 % IJ SOLN
INTRAMUSCULAR | Status: AC
  Filled 2016-10-13: qty 30

## 2016-10-13 MED ORDER — INFLUENZA VAC SPLIT HIGH-DOSE 0.5 ML IM SUSY
0.5000 mL | PREFILLED_SYRINGE | INTRAMUSCULAR | Status: DC
  Filled 2016-10-13: qty 0.5

## 2016-10-13 MED ORDER — ATORVASTATIN CALCIUM 40 MG PO TABS
80.0000 mg | ORAL_TABLET | Freq: Every day | ORAL | Status: DC
  Administered 2016-10-13: 80 mg via ORAL
  Filled 2016-10-13: qty 2

## 2016-10-13 MED ORDER — CIPROFLOXACIN IN D5W 400 MG/200ML IV SOLN
INTRAVENOUS | Status: AC
  Filled 2016-10-13: qty 200

## 2016-10-13 MED ORDER — POTASSIUM CHLORIDE IN NACL 20-0.45 MEQ/L-% IV SOLN
INTRAVENOUS | Status: DC
  Administered 2016-10-13 – 2016-10-14 (×2): via INTRAVENOUS
  Filled 2016-10-13 (×2): qty 1000

## 2016-10-13 MED ORDER — PROMETHAZINE HCL 25 MG/ML IJ SOLN: 6.2500 mg | INTRAMUSCULAR | Status: DC | PRN

## 2016-10-13 MED ORDER — METFORMIN HCL 500 MG PO TABS
1000.0000 mg | ORAL_TABLET | Freq: Two times a day (BID) | ORAL | Status: DC
  Administered 2016-10-14: 1000 mg via ORAL
  Filled 2016-10-13: qty 2

## 2016-10-13 MED ORDER — LIDOCAINE 2% (20 MG/ML) 5 ML SYRINGE
INTRAMUSCULAR | Status: AC
  Filled 2016-10-13: qty 5

## 2016-10-13 MED ORDER — OXYCODONE HCL 5 MG PO TABS: 5.0000 mg | ORAL_TABLET | Freq: Once | ORAL | Status: DC | PRN

## 2016-10-13 MED ORDER — PROPOFOL 10 MG/ML IV BOLUS
INTRAVENOUS | Status: AC
  Filled 2016-10-13: qty 20

## 2016-10-13 MED ORDER — SUGAMMADEX SODIUM 200 MG/2ML IV SOLN
INTRAVENOUS | Status: DC | PRN
  Administered 2016-10-13: 200 mg via INTRAVENOUS

## 2016-10-13 MED ORDER — HYDROMORPHONE HCL-NACL 0.5-0.9 MG/ML-% IV SOSY
0.5000 mg | PREFILLED_SYRINGE | INTRAVENOUS | Status: DC | PRN
Start: 2016-10-13 — End: 2016-10-14
  Administered 2016-10-13 (×2): 1 mg via INTRAVENOUS
  Filled 2016-10-13 (×2): qty 2

## 2016-10-13 MED ORDER — HYDROMORPHONE HCL 2 MG PO TABS: 2.0000 mg | ORAL_TABLET | ORAL | Status: DC | PRN

## 2016-10-13 MED ORDER — SENNOSIDES-DOCUSATE SODIUM 8.6-50 MG PO TABS: 1.0000 | ORAL_TABLET | Freq: Every evening | ORAL | Status: DC | PRN

## 2016-10-13 MED ORDER — INSULIN ASPART 100 UNIT/ML ~~LOC~~ SOLN
0.0000 [IU] | Freq: Three times a day (TID) | SUBCUTANEOUS | Status: DC
  Administered 2016-10-13 – 2016-10-14 (×2): 3 [IU] via SUBCUTANEOUS

## 2016-10-13 MED ORDER — CIPROFLOXACIN IN D5W 400 MG/200ML IV SOLN
400.0000 mg | Freq: Once | INTRAVENOUS | Status: AC
  Administered 2016-10-13: 400 mg via INTRAVENOUS

## 2016-10-13 MED ORDER — FENTANYL CITRATE (PF) 100 MCG/2ML IJ SOLN
INTRAMUSCULAR | Status: AC | PRN
  Administered 2016-10-13 (×2): 25 ug via INTRAVENOUS
  Administered 2016-10-13: 50 ug via INTRAVENOUS

## 2016-10-13 MED ORDER — ONDANSETRON HCL 4 MG/2ML IJ SOLN
INTRAMUSCULAR | Status: DC | PRN
  Administered 2016-10-13: 4 mg via INTRAVENOUS

## 2016-10-13 MED ORDER — PROPOFOL 10 MG/ML IV BOLUS
INTRAVENOUS | Status: DC | PRN
  Administered 2016-10-13: 150 mg via INTRAVENOUS

## 2016-10-13 MED ORDER — DEXAMETHASONE SODIUM PHOSPHATE 10 MG/ML IJ SOLN
INTRAMUSCULAR | Status: AC
  Filled 2016-10-13: qty 1

## 2016-10-13 MED ORDER — LIDOCAINE HCL 1 % IJ SOLN
INTRAMUSCULAR | Status: AC
  Filled 2016-10-13: qty 20

## 2016-10-13 MED ORDER — AMLODIPINE BESYLATE 10 MG PO TABS: 10.0000 mg | ORAL_TABLET | Freq: Every day | ORAL | Status: DC

## 2016-10-13 SURGICAL SUPPLY — 46 items
BAG URINE DRAINAGE (UROLOGICAL SUPPLIES) ×3 IMPLANT
BASKET ZERO TIP NITINOL 2.4FR (BASKET) ×6 IMPLANT
BENZOIN TINCTURE PRP APPL 2/3 (GAUZE/BANDAGES/DRESSINGS) ×12 IMPLANT
BLADE SURG 15 STRL LF DISP TIS (BLADE) ×1 IMPLANT
BLADE SURG 15 STRL SS (BLADE) ×2
CATH AINSWORTH 30CC 24FR (CATHETERS) ×6 IMPLANT
CATH ROBINSON RED A/P 20FR (CATHETERS) IMPLANT
CATH URET 5FR 28IN OPEN ENDED (CATHETERS) IMPLANT
CATH URET DUAL LUMEN 6-10FR 50 (CATHETERS) ×3 IMPLANT
CATH UROLOGY TORQUE 40 (MISCELLANEOUS) ×3 IMPLANT
CATH X-FORCE N30 NEPHROSTOMY (TUBING) ×3 IMPLANT
COVER SURGICAL LIGHT HANDLE (MISCELLANEOUS) ×3 IMPLANT
DRAPE C-ARM 42X120 X-RAY (DRAPES) ×3 IMPLANT
DRAPE LINGEMAN PERC (DRAPES) ×3 IMPLANT
DRAPE SURG IRRIG POUCH 19X23 (DRAPES) ×3 IMPLANT
DRSG PAD ABDOMINAL 8X10 ST (GAUZE/BANDAGES/DRESSINGS) ×6 IMPLANT
DRSG TEGADERM 8X12 (GAUZE/BANDAGES/DRESSINGS) ×9 IMPLANT
FIBER LASER FLEXIVA 1000 (UROLOGICAL SUPPLIES) IMPLANT
FIBER LASER FLEXIVA 365 (UROLOGICAL SUPPLIES) IMPLANT
FIBER LASER FLEXIVA 550 (UROLOGICAL SUPPLIES) IMPLANT
FIBER LASER TRAC TIP (UROLOGICAL SUPPLIES) IMPLANT
GAUZE SPONGE 4X4 12PLY STRL (GAUZE/BANDAGES/DRESSINGS) ×6 IMPLANT
GLOVE SURG SS PI 8.0 STRL IVOR (GLOVE) IMPLANT
GOWN STRL REUS W/TWL XL LVL3 (GOWN DISPOSABLE) ×3 IMPLANT
GUIDEWIRE AMPLATZ STIFF 0.35 (WIRE) ×3 IMPLANT
GUIDEWIRE STR DUAL SENSOR (WIRE) ×3 IMPLANT
KIT BASIN OR (CUSTOM PROCEDURE TRAY) ×3 IMPLANT
MANIFOLD NEPTUNE II (INSTRUMENTS) ×3 IMPLANT
MASK EYE SHIELD (GAUZE/BANDAGES/DRESSINGS) ×3 IMPLANT
NS IRRIG 1000ML POUR BTL (IV SOLUTION) ×3 IMPLANT
PACK CYSTO (CUSTOM PROCEDURE TRAY) ×3 IMPLANT
PAD ABD 8X10 STRL (GAUZE/BANDAGES/DRESSINGS) ×9 IMPLANT
PROBE LITHOCLAST ULTRA 3.8X403 (UROLOGICAL SUPPLIES) ×3 IMPLANT
PROBE PNEUMATIC 1.0MMX570MM (UROLOGICAL SUPPLIES) IMPLANT
SET IRRIG Y TYPE TUR BLADDER L (SET/KITS/TRAYS/PACK) ×3 IMPLANT
SPONGE LAP 4X18 X RAY DECT (DISPOSABLE) ×3 IMPLANT
STONE CATCHER W/TUBE ADAPTER (UROLOGICAL SUPPLIES) IMPLANT
SUT SILK 2 0 30  PSL (SUTURE) ×2
SUT SILK 2 0 30 PSL (SUTURE) ×1 IMPLANT
SYR 10ML LL (SYRINGE) ×3 IMPLANT
SYR 20CC LL (SYRINGE) ×6 IMPLANT
TOWEL OR 17X26 10 PK STRL BLUE (TOWEL DISPOSABLE) ×3 IMPLANT
TOWEL OR NON WOVEN STRL DISP B (DISPOSABLE) ×3 IMPLANT
TRAY FOLEY W/METER SILVER 16FR (SET/KITS/TRAYS/PACK) ×3 IMPLANT
TUBING CONNECTING 10 (TUBING) ×6 IMPLANT
TUBING CONNECTING 10' (TUBING) ×3

## 2016-10-13 NOTE — Brief Op Note (Signed)
10/13/2016  1:41 PM  PATIENT:  Timothy Ray  66 y.o. male  PRE-OPERATIVE DIAGNOSIS:  LEFT URETEROPELVIC JUNCTION STONE 1.9cm  POST-OPERATIVE DIAGNOSIS:  LEFT URETEROPELVIC JUNCTION STONE 1.9cm  PROCEDURE:  Procedure(s) with comments: NEPHROLITHOTOMY PERCUTANEOUS (Left) - ONLY NEEDS 90 MINUTES FOR PROCEDURE  ANTEGRADE NEPHROSTOGRAM EXISTING TRACT  SURGEON:  Surgeon(s) and Role:    Irine Seal, MD - Primary  PHYSICIAN ASSISTANT:   ASSISTANTS: none   ANESTHESIA:   general  EBL:  No intake/output data recorded.  BLOOD ADMINISTERED:none  DRAINS: Urinary Catheter (Foley) and 8fr and 5fr nephrostomy tubes   LOCAL MEDICATIONS USED:  NONE  SPECIMEN:  Source of Specimen:  stone fragments  DISPOSITION OF SPECIMEN:  to family to bring to office  COUNTS:  YES  TOURNIQUET:  * No tourniquets in log *  DICTATION: .Other Dictation: Dictation Number [id number]  PLAN OF CARE: Admit for overnight observation  PATIENT DISPOSITION:  PACU - hemodynamically stable.   Delay start of Pharmacological VTE agent (>24hrs) due to surgical blood loss or risk of bleeding: yes

## 2016-10-13 NOTE — Anesthesia Procedure Notes (Signed)
Procedure Name: Intubation Date/Time: 10/13/2016 12:23 PM Performed by: Glory Buff Pre-anesthesia Checklist: Patient identified, Emergency Drugs available, Suction available and Patient being monitored Patient Re-evaluated:Patient Re-evaluated prior to induction Oxygen Delivery Method: Circle system utilized Preoxygenation: Pre-oxygenation with 100% oxygen Induction Type: IV induction Ventilation: Mask ventilation without difficulty Laryngoscope Size: Miller and 3 Grade View: Grade II Tube type: Oral Tube size: 7.5 mm Number of attempts: 1 Airway Equipment and Method: Stylet and Oral airway Placement Confirmation: ETT inserted through vocal cords under direct vision,  positive ETCO2 and breath sounds checked- equal and bilateral Secured at: 22 cm Tube secured with: Tape Dental Injury: Teeth and Oropharynx as per pre-operative assessment

## 2016-10-13 NOTE — Sedation Documentation (Signed)
Patient denies pain and is resting comfortably.  

## 2016-10-13 NOTE — Sedation Documentation (Signed)
Patient is resting comfortably. 

## 2016-10-13 NOTE — Anesthesia Preprocedure Evaluation (Addendum)
Anesthesia Evaluation  Patient identified by MRN, date of birth, ID band Patient awake    Reviewed: Allergy & Precautions, NPO status , Patient's Chart, lab work & pertinent test results  Airway Mallampati: III  TM Distance: >3 FB Neck ROM: Full    Dental no notable dental hx.    Pulmonary neg pulmonary ROS,    Pulmonary exam normal breath sounds clear to auscultation       Cardiovascular hypertension, Pt. on medications Normal cardiovascular exam Rhythm:Regular Rate:Normal  ECG: NSR, rate 62   Neuro/Psych negative neurological ROS  negative psych ROS   GI/Hepatic negative GI ROS, Neg liver ROS,   Endo/Other  diabetes, Oral Hypoglycemic Agents  Renal/GU Renal diseaseCKD   LEFT URETEROPELVIC JUNCTION STONE    Musculoskeletal negative musculoskeletal ROS (+)   Abdominal   Peds  Hematology  (+) anemia ,   Anesthesia Other Findings HLD  Reproductive/Obstetrics                            Anesthesia Physical Anesthesia Plan  ASA: III  Anesthesia Plan: General   Post-op Pain Management:    Induction: Intravenous  PONV Risk Score and Plan: 2 and Ondansetron, Dexamethasone and Midazolam  Airway Management Planned: Oral ETT  Additional Equipment:   Intra-op Plan:   Post-operative Plan: Extubation in OR  Informed Consent: I have reviewed the patients History and Physical, chart, labs and discussed the procedure including the risks, benefits and alternatives for the proposed anesthesia with the patient or authorized representative who has indicated his/her understanding and acceptance.   Dental advisory given  Plan Discussed with: CRNA  Anesthesia Plan Comments:         Anesthesia Quick Evaluation

## 2016-10-13 NOTE — Progress Notes (Signed)
Nephrostomy tube placement site left flank -gauze dressing D/I.  Tubing clamped beneath dressing.

## 2016-10-13 NOTE — Interval H&P Note (Signed)
History and Physical Interval Note:  Cr is up to 2.43 from 2.08.   Ok to proceed with PCNL.   10/13/2016 12:07 PM  Timothy Ray  has presented today for surgery, with the diagnosis of LEFT URETEROPELVIC JUNCTION STONE  The various methods of treatment have been discussed with the patient and family. After consideration of risks, benefits and other options for treatment, the patient has consented to  Procedure(s) with comments: NEPHROLITHOTOMY PERCUTANEOUS (Left) - ONLY NEEDS 90 MINUTES FOR PROCEDURE as a surgical intervention .  The patient's history has been reviewed, patient examined, no change in status, stable for surgery.  I have reviewed the patient's chart and labs.  Questions were answered to the patient's satisfaction.     Gurvir Schrom J

## 2016-10-13 NOTE — Transfer of Care (Signed)
Immediate Anesthesia Transfer of Care Note  Patient: Timothy Ray  Procedure(s) Performed: NEPHROLITHOTOMY PERCUTANEOUS (Left )  Patient Location: PACU  Anesthesia Type:General  Level of Consciousness: awake, alert  and oriented  Airway & Oxygen Therapy: Patient Spontanous Breathing and Patient connected to face mask oxygen  Post-op Assessment: Report given to RN and Post -op Vital signs reviewed and stable  Post vital signs: Reviewed and stable  Last Vitals:  Vitals:   10/13/16 1100 10/13/16 1130  BP: (!) 112/91 119/61  Pulse: 72 71  Resp:    Temp:    SpO2: 99% 97%    Last Pain:  Vitals:   10/13/16 1100  TempSrc:   PainSc: 3       Patients Stated Pain Goal: 4 (46/19/01 2224)  Complications: No apparent anesthesia complications

## 2016-10-13 NOTE — Discharge Instructions (Signed)
Percutaneous Nephrostomy, Care After This sheet gives you information about how to care for yourself after your procedure. Your health care provider may also give you more specific instructions. If you have problems or questions, contact your health care provider. What can I expect after the procedure? After the procedure, it is common to have:  Some soreness where the nephrostomy tube was inserted (tube insertion site).  Blood-tinged drainage from the nephrostomy tube for the first 24 hours.  Follow these instructions at home: Activity  Return to your normal activities as told by your health care provider. Ask your health care provider what activities are safe for you.  Avoid activities that may cause the nephrostomy tubing to bend.  Do not take baths, swim, or use a hot tub until your health care provider approves. Ask your health care provider if you can take showers. Cover the nephrostomy tube dressing with a watertight covering when you take a shower.  Donot drive for 24 hours if you were given a medicine to help you relax (sedative). Care of the tube insertion site  Follow instructions from your health care provider about how to take care of your tube insertion site. Make sure you: ? Wash your hands with soap and water before you change your bandage (dressing). If soap and water are not available, use hand sanitizer. ? Change your dressing as told by your health care provider. Be careful not to pull on the tube while removing the dressing. ? When you change the dressing, wash the skin around the tube, rinse well, and pat the skin dry.  Check the tube insertion area every day for signs of infection. Check for: ? More redness, swelling, or pain. ? More fluid or blood. ? Warmth. ? Pus or a bad smell. Care of the nephrostomy tube and drainage bag  Always keep the tubing, the leg bag, or the bedside drainage bags below the level of the kidney so that your urine drains  freely.  When connecting your nephrostomy tube to a drainage bag, make sure that there are no kinks in the tubing and that your urine is draining freely. You may want to use an elastic bandage to wrap any exposed tubing that goes from the nephrostomy tube to any of the connecting tubes.  At night, you may want to connect your nephrostomy tube or the leg bag to a larger bedside drainage bag.  Follow instructions from your health care provider about how to empty or change the drainage bag.  Empty the drainage bag when it becomes ? full.  Replace the drainage bag and any extension tubing that is connected to your nephrostomy tube every 3 weeks or as often as told by your health care provider. Your health care provider will explain how to change the drainage bag and extension tubing. General instructions  Take over-the-counter and prescription medicines only as told by your health care provider.  Keep all follow-up visits as told by your health care provider. This is important. Contact a health care provider if:  You have problems with any of the valves or tubing.  You have persistent pain or soreness in your back.  You have more redness, swelling, or pain around your tube insertion site.  You have more fluid or blood coming from your tube insertion site.  Your tube insertion site feels warm to the touch.  You have pus or a bad smell coming from your tube insertion site.  You have increased urine output or you feel  burning when urinating. Get help right away if:  You have pain in your abdomen during the first week.  You have chest pain or have trouble breathing.  You have a new appearance of blood in your urine.  You have a fever or chills.  You have back pain that is not relieved by your medicine.  You have decreased urine output.  Your nephrostomy tube comes out. This information is not intended to replace advice given to you by your health care provider. Make sure you  discuss any questions you have with your health care provider. Document Released: 08/13/2003 Document Revised: 10/02/2015 Document Reviewed: 10/02/2015 Elsevier Interactive Patient Education  2018 [employer].   Percutaneous Nephrostomy Home Guide Percutaneous nephrostomy is a procedure to insert a flexible tube into your kidney so that urine can leave your body. This procedure may be done if a medical condition prevents urine from leaving your kidney in the usual way. During the procedure, the nephrostomy tube is inserted in the right or left side of your lower back and is connected to an external drainage bag. After you have a nephrostomy tube placed, urine will collect in the drainage bag outside of your body. You will need to empty and change the drainage bag as needed. You will also need to take steps to care for the area where the nephrostomy tube was inserted (tube insertion site). How do I care for my nephrostomy tube?  Always keep your tubing, the leg bag, or the bedside drainage bag below the level of your kidney so that your urine drains freely.  Avoid activities that would cause bending or pulling of your tubing. Ask your health care provider what activities are safe for you.  When connecting your nephrostomy tube to a drainage bag, make sure that there are no kinks in the tubing and that your urine is draining freely. You may want to gently wrap an elastic bandage over the tubing. This will help keep the tubing in place and prevent it from kinking. Make sure there is no tension on the tubing so it does not become dislodged.  At night, you may want to connect your nephrostomy tube or the leg bag to a larger bedside drainage bag. How do I empty the drainage bag? Empty the leg bag or bedside drainage bag whenever it becomes ? full. Also empty it before you go to sleep. Most drainage bags have a drain at the bottom that allows urine to be emptied. Follow these basic steps: 1. Hold the  drainage bag over a toilet or collection container. Use a measuring container if your health care provider told you to measure your urine. 2. Open the drain of the bag and allow the urine to drain out. 3. After all the urine has drained from the drainage bag, close the drain fully. 4. Flush the urine down the toilet. If a collection container was used, rinse the container.  How do I change the dressing around the nephrostomy tube? Change your dressing and clean your tube exit site as told by your health care provider. You may need to change the dressing every day for the first 2 weeks after having a nephrostomy tube inserted. After the first 2 weeks, you may be told to change the dressing two times a week. Supplies needed:  Mild soap and water.  Split gauze pads, 4  4 inches (10 x 10 cm).  Gauze pads, 4  4 inches (10 x 10 cm).  Paper tape. How  to change the dressing: Because of the location of your nephrostomy tube, you may need help from another person to complete dressing changes. Follow these basic steps: 1. Wash hands with soap and water. 2. Gently remove the tape and dressing from around the nephrostomy tube. Be careful not to pull on the tube while removing the dressing. Avoid using scissors because they may damage the tube. 3. Wash the skin around the tube with mild soap and water, rinse well, and pat the skin dry with a clean cloth. 4. Check the skin around the drain for redness, swelling, pus, warmth, or a bad smell. 5. If the drain was sutured to the skin, check the suture to verify that it is still anchored in the skin. 6. Place two split gauze pads in and around the tube exit site. Do not apply ointments or alcohol to the site. 7. Place a gauze pad on top of the split gauze pad. 8. Coil the tube on top of the gauze. The tubing should rest on the gauze, not on the skin. 9. Place tape around each edge of the gauze pad. 10. Secure the nephrostomy tubing. Make sure that the tube  does not kink or become pinched. The tubing should rest on the gauze pad, not on the skin. 11. Dispose of used supplies properly.  How do I flush my nephrostomy tube? Use a saline syringe to rinse out (flush) your nephrostomy tube as told by your health care provider. Flushing is easier if a three-way stopcock is placed between the tube and the drainage bag. One connection of the stopcock connects to your tube, the second connects to the drainage bag, and the third is usually covered with a cap. The lever on the stopcock points to the direction on the stopcock that is closed to flow. Normally, the lever points in the direction of the cap to allow urine to drain from the tube to the drainage bag. Supplies needed:  Rubbing alcohol wipe.  10 mL 0.9% saline syringe. How to flush the tube: 1. Move the lever of the three-way stopcock so it points toward the drainage bag. 2. Clean the cap with a rubbing alcohol wipe. 3. Screw the tip of a 10 mL 0.9% saline syringe onto the cap. 4. Using the syringe plunger, slowly push the 10 mL 0.9% saline in the syringe over 5-10 seconds. If resistance is met or pain occurs while pushing, stop pushing the saline. 5. Remove the syringe from the cap. 6. Return the stopcock lever to the usual position, pointing in the direction of the cap. 7. Dispose of used supplies properly. How do I replace the drainage bag? Replace the drainage bag, three-way stopcock, and any extension tubing as told by your health care provider. Make sure you always have an extra drainage bag and connecting tubing available. 1. Empty urine from your drainage bag. 2. Gather a new drainage bag, three-way stopcock, and any extension tubing. 3. Remove the drainage bag, three-way stopcock, and any extension tubing from the nephrostomy tube. 4. Attach the new leg bag or bedside drainage bag, three-way stopcock, and any extension tubing to the nephrostomy tube. 5. Dispose of the used drainage bag,  three-way stopcock, and any extension.  Contact a health care provider if:  You have problems with any of the valves or tubing.  You have persistent pain or soreness in your back.  You have more redness, swelling, or pain around your tube insertion site.  You have more fluid or blood  coming from your tube insertion site.  Your tube insertion site feels warm to the touch.  You have pus or a bad smell coming from your tube insertion site.  You have increased urine output or you feel burning when urinating. Get help right away if:  You have pain in your abdomen during the first week.  You have chest pain or have trouble breathing.  You have a new appearance of blood in your urine.  You have a fever or chills.  You have back pain that is not relieved by your medicine.  You have decreased urine output.  Your nephrostomy tube comes out. This information is not intended to replace advice given to you by your health care provider. Make sure you discuss any questions you have with your health care provider. Document Released: 10/10/2012 Document Revised: 10/02/2015 Document Reviewed: 10/02/2015 Elsevier Interactive Patient Education  [employer].

## 2016-10-13 NOTE — Consult Note (Signed)
Chief Complaint: left nephrolithiasis  Referring Physician:Dr. Irine Seal  Supervising Physician: Jacqulynn Cadet  Patient Status: Timothy Ray - Out-pt  HPI: Timothy Ray is a 66 y.o. male who began having left sided flank pain with nausea.  He had a CT scan which revealed a 62mm left renal stone with some obstruction.  He was referred to Dr. Jeffie Pollock who saw him in the office.  It was felt the patient would benefit from a PCNL to remove the stone in one procedure.  A request has been made for IR to proceed with access prior to the surgical procedure.  He presents today for this with no new complaints.   Past Medical History:  Past Medical History:  Diagnosis Date  . Diabetes mellitus without complication ([place])    type 2  . History of kidney stones    2018  . Hypertension     Past Surgical History:  Past Surgical History:  Procedure Laterality Date  . APPENDECTOMY     66 years old    Family History: History reviewed. No pertinent family history.  Social History:  reports that he has never smoked. He has never used smokeless tobacco. He reports that he drinks alcohol. He reports that he does not use drugs.  Allergies:  Allergies  Allergen Reactions  . Penicillins Rash    Has patient had a PCN reaction causing immediate rash, facial/tongue/throat swelling, SOB or lightheadedness with hypotension: Yes Has patient had a PCN reaction causing Ray rash involving mucus membranes or skin necrosis: No Has patient had a PCN reaction that required hospitalization: Yes - was already in the hospital when reaction happened Has patient had a PCN reaction occurring within the last 10 years: No If all of the above answers are "NO", then may proceed with Cephalosporin use.     Medications: Medications were reviewed in epic  Please HPI for pertinent positives, otherwise complete 10 system ROS negative.  Mallampati Score: MD Evaluation Airway: WNL Heart: WNL Abdomen: WNL Chest/ Lungs:  WNL ASA  Classification: 2 Mallampati/Airway Score: Two  Physical Exam: Temp (F)   98.1  98.1 (36.7)  10/11 0745  Pulse Rate   84  84  10/11 0745  Resp   18  18  10/11 0745  BP   134/63  134/63  10/11 0745  SpO2 (%)   97  97  10/11 0745   General: pleasant, WD, WN white male who is laying in bed in NAD HEENT: head is normocephalic, atraumatic.  Sclera are noninjected.  PERRL.  Ears and nose without any masses or lesions.  Mouth is pink and moist Heart: regular, rate, and rhythm.  Normal s1,s2. No obvious murmurs, gallops, or rubs noted.  Palpable radial and pedal pulses bilaterally Lungs: CTAB, no wheezes, rhonchi, or rales noted.  Respiratory effort nonlabored Abd: soft, NT, ND, +BS, no masses, hernias, or organomegaly Psych: A&Ox3 with an appropriate affect.   Labs: No results found for this or any previous visit (from the past 48 hour(s)).  Imaging: No results found.  Assessment/Plan 1. Left nephrolithiasis  We will plan today to get nephroureteral access today so the patient can then go to the OR for stone extraction.  His vitals and previous labs have been reviewed.  Risks and benefits of nephroureteral access were discussed with the patient including, but not limited to, infection, bleeding, significant bleeding causing loss or decrease in renal function or damage to adjacent structures.   All of the patient's questions were  answered, patient is agreeable to proceed.  Consent signed and in chart.   Thank you for this interesting consult.  I greatly enjoyed meeting Edan Juday and look forward to participating in their care.  A copy of this report was sent to the requesting provider on this date.  Electronically Signed: Henreitta Cea 10/13/2016, 9:29 AM   I spent a total of  30 Minutes   in face to face in clinical consultation, greater than 50% of which was counseling/coordinating care for left side nephrolithiasis

## 2016-10-13 NOTE — Anesthesia Postprocedure Evaluation (Signed)
Anesthesia Post Note  Patient: Timothy Ray  Procedure(s) Performed: NEPHROLITHOTOMY PERCUTANEOUS (Left )     Patient location during evaluation: PACU Anesthesia Type: General Level of consciousness: awake and alert Pain management: pain level controlled Vital Signs Assessment: post-procedure vital signs reviewed and stable Respiratory status: spontaneous breathing, nonlabored ventilation, respiratory function stable and patient connected to nasal cannula oxygen Cardiovascular status: blood pressure returned to baseline and stable Postop Assessment: no apparent nausea or vomiting Anesthetic complications: no    Last Vitals:  Vitals:   10/13/16 1515 10/13/16 1530  BP: 119/65 (!) 121/57  Pulse: 69 72  Resp: 12 16  Temp:  36.6 C  SpO2: 99% 100%    Last Pain:  Vitals:   10/13/16 2009  TempSrc:   PainSc: 0-No pain                 Ryan P Ellender

## 2016-10-13 NOTE — Procedures (Signed)
Interventional Radiology Procedure Note  Procedure: Placement of a 15F catheter through an inferior, posterior calyx, past the pelvic stone, down the ureter and into the bladder.   Complications: None  Estimated Blood Loss: None  Recommendations: - To OR for PCNL  Signed,  Criselda Peaches, MD

## 2016-10-13 NOTE — Progress Notes (Signed)
Patient ID: Timothy Ray, male   DOB: 1950/01/09, 66 y.o.   MRN: [id number] [place] is doing well post op with minimal pain but some urinary urgency.  BP (!) 121/57 (BP Location: Left Arm)   Pulse 72   Temp 97.9 F (36.6 C)   Resp 16   Ht 5\' 9"  (1.753 m)   Wt 83.9 kg (185 lb)   SpO2 100%   BMI 27.32 kg/m   NT draining light pink urine.    He will be discharged in the morning with the NT's and will have a f/u appt on Monday for tube removal.

## 2016-10-14 ENCOUNTER — Encounter (HOSPITAL_COMMUNITY): Payer: Self-pay | Admitting: Urology

## 2016-10-14 DIAGNOSIS — E1122 Type 2 diabetes mellitus with diabetic chronic kidney disease: Secondary | ICD-10-CM | POA: Diagnosis not present

## 2016-10-14 DIAGNOSIS — N132 Hydronephrosis with renal and ureteral calculous obstruction: Secondary | ICD-10-CM | POA: Diagnosis not present

## 2016-10-14 DIAGNOSIS — N289 Disorder of kidney and ureter, unspecified: Secondary | ICD-10-CM | POA: Diagnosis not present

## 2016-10-14 DIAGNOSIS — N200 Calculus of kidney: Secondary | ICD-10-CM | POA: Diagnosis not present

## 2016-10-14 DIAGNOSIS — N183 Chronic kidney disease, stage 3 (moderate): Secondary | ICD-10-CM | POA: Diagnosis not present

## 2016-10-14 DIAGNOSIS — E785 Hyperlipidemia, unspecified: Secondary | ICD-10-CM | POA: Diagnosis not present

## 2016-10-14 DIAGNOSIS — I129 Hypertensive chronic kidney disease with stage 1 through stage 4 chronic kidney disease, or unspecified chronic kidney disease: Secondary | ICD-10-CM | POA: Diagnosis not present

## 2016-10-14 LAB — BASIC METABOLIC PANEL
ANION GAP: 12 (ref 5–15)
BUN: 38 mg/dL — ABNORMAL HIGH (ref 6–20)
CO2: 21 mmol/L — ABNORMAL LOW (ref 22–32)
Calcium: 9 mg/dL (ref 8.9–10.3)
Chloride: 102 mmol/L (ref 101–111)
Creatinine, Ser: 2.06 mg/dL — ABNORMAL HIGH (ref 0.61–1.24)
GFR calc Af Amer: 36 mL/min — ABNORMAL LOW (ref >60)
GFR, EST NON AFRICAN AMERICAN: 31 mL/min — AB (ref >60)
GLUCOSE: 202 mg/dL — AB (ref 65–99)
POTASSIUM: 4.5 mmol/L (ref 3.5–5.1)
Sodium: 135 mmol/L (ref 135–145)

## 2016-10-14 LAB — GLUCOSE, CAPILLARY
Glucose-Capillary: 255 mg/dL — ABNORMAL HIGH (ref 65–99)
Glucose-Capillary: 161 mg/dL — ABNORMAL HIGH (ref 65–99)

## 2016-10-14 LAB — HEMOGLOBIN AND HEMATOCRIT, BLOOD
HCT: 29.2 % — ABNORMAL LOW (ref 39.0–52.0)
Hemoglobin: 10.2 g/dL — ABNORMAL LOW (ref 13.0–17.0)

## 2016-10-14 NOTE — Discharge Summary (Signed)
Physician Discharge Summary  Patient ID: Duff Pozzi MRN: [id number] DOB/AGE: [birth year] 66 y.o.  Admit date: 10/13/2016 Discharge date: 10/14/2016  Admission Diagnoses:  Discharge Diagnoses:  Active Problems:   Left nephrolithiasis   Discharged Condition: good  Hospital Course: the patient was admitted and underwent a left PCNL.  He was observed overnight.  His hemoglobin was stable except for a slight drop, likely dilutional.  No evidence of any significant bleeding on exam.  He was discharged home on postoperative day 1 stable condition  Consults: None  Significant Diagnostic Studies: none  Treatments: surgery: left PCNL  Discharge Exam: Blood pressure (!) 143/62, pulse 81, temperature 98.1 F (36.7 C), temperature source Oral, resp. rate 18, height 5\' 9"  (1.753 m), weight 83.9 kg (185 lb), SpO2 96 %. General: Patient is in no apparent distress Lungs: Normal respiratory effort, chest expands symmetrically. GI: the abdomen is soft and nontender without mass. Left PCN: pink tinged urine Ext: lower extremities symmetric  Disposition: 01-Home or Self Care   Allergies as of 10/14/2016      Reactions   Penicillins Rash   Has patient had a PCN reaction causing immediate rash, facial/tongue/throat swelling, SOB or lightheadedness with hypotension: Yes Has patient had a PCN reaction causing severe rash involving mucus membranes or skin necrosis: No Has patient had a PCN reaction that required hospitalization: Yes - was already in the hospital when reaction happened Has patient had a PCN reaction occurring within the last 10 years: No If all of the above answers are "NO", then may proceed with Cephalosporin use.      Medication List    TAKE these medications   acetaminophen 650 MG CR tablet Commonly known as:  TYLENOL Take 1,300 mg by mouth every 8 (eight) hours.   amLODipine 10 MG tablet Commonly known as:  NORVASC Take 10 mg by mouth daily.   aspirin EC 81 MG  tablet Take 81 mg by mouth daily.   atorvastatin 80 MG tablet Commonly known as:  LIPITOR Take 80 mg by mouth daily.   glipiZIDE 10 MG 24 hr tablet Commonly known as:  GLUCOTROL XL Take 10 mg by mouth daily with breakfast. Takes along with the 5 mg to make a total of 15 mg a day   glipiZIDE 5 MG 24 hr tablet Commonly known as:  GLUCOTROL XL Take 5 mg by mouth daily with breakfast. Takes along with the 10 mg to make a total of 15 mg a day   hydrochlorothiazide 12.5 MG capsule Commonly known as:  MICROZIDE Take 12.5 mg by mouth daily.   HYDROcodone-acetaminophen 5-325 MG tablet Commonly known as:  NORCO/VICODIN Take 1 tablet by mouth every 6 (six) hours as needed for moderate pain.   HYDROmorphone 2 MG tablet Commonly known as:  DILAUDID Take 2-4 mg by mouth every 6 (six) hours as needed for severe pain (depends on pain if takes 1-2 tablets).   losartan 25 MG tablet Commonly known as:  COZAAR Take 25 mg by mouth daily.   metFORMIN 1000 MG tablet Commonly known as:  GLUCOPHAGE Take 1,000 mg by mouth 2 (two) times daily.   modafinil 200 MG tablet Commonly known as:  PROVIGIL Take 100-200 mg by mouth daily as needed (for fatigue). 0.5-1 tablet depending on fatigue   nebivolol 10 MG tablet Commonly known as:  BYSTOLIC Take 10 mg by mouth daily.   omeprazole 20 MG tablet Commonly known as:  PRILOSEC OTC Take 20 mg by mouth daily as needed (for  acid reflex).   ondansetron 4 MG tablet Commonly known as:  ZOFRAN Take 1 tablet (4 mg total) by mouth every 6 (six) hours.   Vitamin D (Ergocalciferol) 50000 units Caps capsule Commonly known as:  DRISDOL Take 50,000 Units by mouth every 14 (fourteen) days.      Follow-up Information    Irine Seal, MD On 10/17/2016.   Specialty:  Urology Why:  I will have the office set up a time in the morning to have the tubes removed.  Contact information: [place] [phone]            Signed: Marton Redwood, III 10/14/2016, 10:44 AM

## 2016-10-14 NOTE — Progress Notes (Signed)
Urology Inpatient Progress Report  LEFT URETEROPELVIC JUNCTION STONE  Procedure(s): NEPHROLITHOTOMY PERCUTANEOUS  1 Day Post-Op   Intv/Subj: No acute events overnight. Patient is without complaint. He has voided post foley removal.  Active Problems:   Left nephrolithiasis  Current Facility-Administered Medications  Medication Dose Route Frequency Provider Last Rate Last Dose  . 0.45 % NaCl with KCl 20 mEq / L infusion   Intravenous Continuous Irine Seal, MD   Stopped at 10/14/16 1610  . acetaminophen (TYLENOL) tablet 650 mg  650 mg Oral Q4H PRN Irine Seal, MD   650 mg at 10/14/16 0846  . amLODipine (NORVASC) tablet 10 mg  10 mg Oral Daily Irine Seal, MD      . atorvastatin (LIPITOR) tablet 80 mg  80 mg Oral q1800 Irine Seal, MD   80 mg at 10/13/16 1750  . bisacodyl (DULCOLAX) suppository 10 mg  10 mg Rectal Daily PRN Irine Seal, MD      . diphenhydrAMINE (BENADRYL) injection 12.5 mg  12.5 mg Intravenous Q6H PRN Irine Seal, MD       Or  . diphenhydrAMINE (BENADRYL) 12.5 MG/5ML elixir 12.5 mg  12.5 mg Oral Q6H PRN Irine Seal, MD      . docusate sodium (COLACE) capsule 100 mg  100 mg Oral BID Irine Seal, MD   100 mg at 10/13/16 2123  . glipiZIDE (GLUCOTROL XL) 24 hr tablet 10 mg  10 mg Oral Q breakfast Irine Seal, MD   10 mg at 10/14/16 0815  . glipiZIDE (GLUCOTROL XL) 24 hr tablet 5 mg  5 mg Oral Q breakfast Irine Seal, MD   5 mg at 10/14/16 9604  . hydrochlorothiazide (MICROZIDE) capsule 12.5 mg  12.5 mg Oral Daily Irine Seal, MD      . HYDROmorphone (DILAUDID) injection 0.5-1 mg  0.5-1 mg Intravenous Q2H PRN Irine Seal, MD   1 mg at 10/13/16 2306  . HYDROmorphone (DILAUDID) tablet 2 mg  2 mg Oral Q4H PRN Irine Seal, MD      . hyoscyamine (LEVSIN SL) SL tablet 0.125 mg  0.125 mg Sublingual Q4H PRN Irine Seal, MD   0.125 mg at 10/14/16 5409  . Influenza vac split quadrivalent PF (FLUZONE HIGH-DOSE) injection 0.5 mL  0.5 mL Intramuscular Tomorrow-1000 Irine Seal, MD       . insulin aspart (novoLOG) injection 0-15 Units  0-15 Units Subcutaneous TID WC Irine Seal, MD   3 Units at 10/14/16 [phone]  . losartan (COZAAR) tablet 25 mg  25 mg Oral Daily Irine Seal, MD      . metFORMIN (GLUCOPHAGE) tablet 1,000 mg  1,000 mg Oral BID WC Irine Seal, MD   1,000 mg at 10/14/16 0814  . modafinil (PROVIGIL) tablet 100-200 mg  100-200 mg Oral Daily PRN Irine Seal, MD      . nebivolol (BYSTOLIC) tablet 10 mg  10 mg Oral Daily Irine Seal, MD      . ondansetron [facility]) injection 4 mg  4 mg Intravenous Q4H PRN Irine Seal, MD      . pantoprazole (PROTONIX) EC tablet 40 mg  40 mg Oral Daily Absher, Julieta Bellini, RPH      . senna-docusate (Senokot-S) tablet 1 tablet  1 tablet Oral QHS PRN Irine Seal, MD      . sodium phosphate (FLEET) 7-19 GM/118ML enema 1 enema  1 enema Rectal Once PRN Irine Seal, MD      . zolpidem (AMBIEN) tablet 5 mg  5 mg Oral QHS PRN Irine Seal,  MD         Objective: Vital: Vitals:   10/13/16 1515 10/13/16 1530 10/13/16 2123 10/14/16 0428  BP: 119/65 (!) 121/57 (!) 113/49 (!) 143/62  Pulse: 69 72 81 81  Resp: 12 16 18 18   Temp:  97.9 F (36.6 C) 98.2 F (36.8 C) 98.1 F (36.7 C)  TempSrc:   Oral Oral  SpO2: 99% 100% 99% 96%  Weight:      Height:       I/Os: I/O last 3 completed shifts: In: 1716.7 [I.V.:1716.7] Out: 404 [Urine:300; Stool:4; Blood:100]  Physical Exam:  General: Patient is in no apparent distress Lungs: Normal respiratory effort, chest expands symmetrically. GI: the abdomen is soft and nontender without mass. Left PCN: pink tinged urine Ext: lower extremities symmetric  Lab Results:  Recent Labs  10/13/16 0820 10/14/16 0450  WBC 7.5  --   HGB 11.6* 10.2*  HCT 32.8* 29.2*    Recent Labs  10/13/16 0820 10/14/16 0450  NA 137 135  K 3.8 4.5  CL 103 102  CO2 21* 21*  GLUCOSE 151* 202*  BUN 39* 38*  CREATININE 2.43* 2.06*  CALCIUM 9.9 9.0    Recent Labs  10/13/16 0820  INR 1.01   No results for  input(s): LABURIN in the last 72 hours. No results found for this or any previous visit.  Studies/Results: Dg C-arm 1-60 Min-no Report  Result Date: 10/13/2016 Fluoroscopy was utilized by the requesting physician.  No radiographic interpretation.   Ir Ureteral Stent Left New Access W/o Sep Nephrostomy Cath  Result Date: 10/13/2016 INDICATION: Renal stones, access for left percutaneous nephrolithotomy. EXAM: 1. Percutaneous puncture of the renal collecting system under fluoroscopic guidance 2. Placement of a percutaneous nephrostomy tube Operating Physician:  Criselda Peaches, MD COMPARISON:  CT of the abdomen and pelvis - 09/22/2016 MEDICATIONS: 400 mg ciprofloxacin; The antibiotic was administered in an appropriate time frame prior to skin puncture. ANESTHESIA/SEDATION: Fentanyl 100 mcg IV; Versed 2 mg IV Moderate Sedation Time:  24 minutes The patient was continuously monitored during the procedure by the interventional radiology nurse under my direct supervision. CONTRAST:  53mL ISOVUE-300 IOPAMIDOL (ISOVUE-300) INJECTION 61%, 90mL ISOVUE-300 IOPAMIDOL (ISOVUE-300) INJECTION 61% - administered into the collecting system(s) FLUOROSCOPY TIME:  Fluoroscopy Time: 1 minutes 42 seconds (28 mGy). COMPLICATIONS: None immediate. PROCEDURE: Informed written consent was obtained from the patient after a thorough discussion of the procedural risks, benefits and alternatives. All questions were addressed. Maximal Sterile Barrier Technique was utilized including caps, mask, sterile gowns, sterile gloves, sterile drape, hand hygiene and skin antiseptic. A timeout was performed prior to the initiation of the procedure. A pre procedural spot fluoroscopic image was obtained of the upper abdomen. Ultrasound scanning performed of the kidney was positive for hydronephrosis. A suitable skin entry site was selected and marked. Local anesthesia was attained by infiltration with 1% lidocaine. A small dermatotomy was made.  A posterior inferior calyx was targeted with a 22 gauge Chiba needle. The calyx was punctured under real-time sonographic guidance. Access to the calyx was confirmed with advancement of a Nitrex wire into the collecting system. An Accustick set was utilized to dilate the tract and was subsequently exchanged for a Kumpe catheter over a Bentson wire. The Kumpe catheter was advanced down the ureter and into the urinary bladder. Postprocedural spot radiographs were obtained in various obliquities and the catheter was sutured to the skin. The catheter was capped and a dressing was placed. The patient tolerated the procedure  well without immediate postprocedural complication. IMPRESSION: Successful ultrasound and fluoroscopic guided left percutaneous nephrostomy with placement of a 5 French Kumpe catheter to the level of the urinary bladder to be utilized during impending nephrolithotomy procedure. Of note, the urine in the obstructed renal collecting system contains a large amount of debris. Signed, Criselda Peaches, MD [facility] Electronically Signed   By: Jacqulynn Cadet M.D.   On: 10/13/2016 11:11    Assessment: left renal calculus Procedure(s):  NEPHROLITHOTOMY PERCUTANEOUS, 1 Day Post-Op  doing well.  Plan: DC home with PCN in place, RTC Monday for removal   Link Snuffer, MD Urology 10/14/2016, 10:42 AM

## 2016-10-14 NOTE — Progress Notes (Signed)
Spoke with RN at [facility]. Explained that patient was discharging from [facility] and is requesting  Prescription for levacin.  She will attempt to reach MD on call.

## 2016-10-14 NOTE — Progress Notes (Signed)
Patient requesting prescription for levacin to use at home.  On call MD paged.

## 2016-10-14 NOTE — Progress Notes (Signed)
Patient requesting prescription for levacin to use at home. Second page to on call MD.

## 2016-10-14 NOTE — Progress Notes (Addendum)
CN spoke with Dr. Jeffie Pollock who stated the levacin has been called into the pharmacy for pickup by patient.  Discharge instructions and medications discussed with patient.  AVS given to patient.  All questions answered.  Patient stated he understood had to care for drain.

## 2016-10-14 NOTE — Care Management Note (Signed)
Case Management Note  Patient Details  Name: Timothy Ray MRN: [id number] Date of Birth: [birth year]  Subjective/Objective:  66 y/o m admitted w/L nephroliathiasis. From home.                  Action/Plan:d/c home.   Expected Discharge Date:  10/14/16               Expected Discharge Plan:  Home/Self Care  In-House Referral:     Discharge planning Services  CM Consult  Post Acute Care Choice:    Choice offered to:     DME Arranged:    DME Agency:     HH Arranged:    HH Agency:     Status of Service:  Completed, signed off  If discussed at [employer] of Stay Meetings, dates discussed:    Additional Comments:  Dessa Phi, RN 10/14/2016, 10:17 AM

## 2016-10-17 DIAGNOSIS — N200 Calculus of kidney: Secondary | ICD-10-CM | POA: Diagnosis not present

## 2016-10-17 NOTE — Op Note (Signed)
Timothy Ray, Timothy Ray NO.:  [id number]  MEDICAL RECORD NO.:  [id number]  LOCATION:                                 FACILITY:  PHYSICIAN:  Marshall Cork. Jeffie Pollock, M.D.    DATE OF BIRTH:  12-Feb-1950  DATE OF PROCEDURE:  10/13/2016 DATE OF DISCHARGE:  10/14/2016                              OPERATIVE REPORT   PROCEDURES: 1. Left percutaneous nephrolithotomy for 1.9-cm stone. 2. Antegrade nephrostogram through existing tract with interpretation.  PREOPERATIVE DIAGNOSIS:  Left ureteropelvic junction stone, 1.9 cm.  POSTOPERATIVE DIAGNOSIS:  Left ureteropelvic junction stone, 1.9 cm.  SURGEON:  Marshall Cork. Jeffie Pollock, MD.  ANESTHESIA:  General.  SPECIMEN:  Stone fragments.  DRAINS:  6-French and 24-French nephrostomy tubes and a 16-French Foley catheter.  BLOOD LOSS:  Approximately 50 mL.  COMPLICATIONS:  None.  INDICATIONS:  Timothy Ray is a 66 year old white male, originally presented with left flank pain.  He was found to have a 1.9-cm left renal pelvic stone with moderate obstruction.  He elected percutaneous nephrolithotomy for treatment.  FINDINGS OF PROCEDURE:  He was taken to the IR suite this morning, given Cipro and a left percutaneous access was obtained through a lower pole calyx by Dr. Tod Ray.  He was then brought to the operating room where general anesthetic was induced on the holding room stretcher.  A 16-French Foley catheter was placed using sterile technique and Betadine prep.  He was then rolled prone on chest rolls and fitted with PAS hose.  Once positioned, his nephrostomy site was prepped with Betadine solution and he was draped in usual sterile fashion.  An Amplatz Super Stiff guidewire was passed through the pre-existing nephrostomy catheter to the bladder and the nephrostomy catheter was removed.  The skin incision was then opened at 2 cm and an 8-French dual- lumen catheter was passed over the wire to the distal ureter.  A second wire was  placed.  The dual-lumen catheter was removed.  The NephroMax nephrostomy tract balloon catheter was placed over the wire into the renal pelvis.  The balloon was filled with 20 atmospheres, and the sheath was advanced over the wire to the renal pelvis.  The balloon was removed and the rigid nephroscope was then passed.  The stone was easily visualized in the renal pelvis.  There was some old blood and associated debris around the stone, but no purulent material was noted.  The percutaneous ultrasonic LithoClast device was then engaged to break the stone into manageable fragments, which were then removed primarily using a 2-jaw grasper.  Once all significant fragments were removed, the flexible nephroscope was passed through the sheath and a few small residual fragments were removed with the basket.  Final inspection revealed no significant residual stones.  A 6-French __________ catheter was passed over the safety wire to the distal ureter, and a 24-French Ainsworth catheter was passed over the wire through the sheath into the renal pelvis.  The sheath was backed out and the balloon was filled with 3 mL of sterile water.  The nephrostomy catheter and safety catheter were secured to the skin with a 2-0 silk suture, which was also used to  partially close the incision. The sheath was removed from the nephrostomy catheter.  The guidewire was removed.  An antegrade nephrostogram was performed through the [facility] catheter.  This revealed good position in the renal pelvis with no extravasation and adequate antegrade flow.  The safety catheter was capped.  The nephrostomy catheter was placed to straight drainage.  The wound was cleansed and dressing was applied.  The patient was rolled back supine on the recovery room stretcher.  Anesthetic was reversed. He was moved to the recovery room in stable condition.  There were no complications.     Marshall Cork. Jeffie Pollock,  M.D.   ______________________________ Marshall Cork. Jeffie Pollock, M.D.    JJW/MEDQ  D:  10/14/2016  T:  10/15/2016  Job:  [id number]

## 2016-10-21 DIAGNOSIS — Z23 Encounter for immunization: Secondary | ICD-10-CM | POA: Diagnosis not present

## 2016-10-25 DIAGNOSIS — E1121 Type 2 diabetes mellitus with diabetic nephropathy: Secondary | ICD-10-CM | POA: Diagnosis not present

## 2016-10-25 DIAGNOSIS — Z79899 Other long term (current) drug therapy: Secondary | ICD-10-CM | POA: Diagnosis not present

## 2016-10-25 DIAGNOSIS — E7800 Pure hypercholesterolemia, unspecified: Secondary | ICD-10-CM | POA: Diagnosis not present

## 2016-10-25 DIAGNOSIS — Z125 Encounter for screening for malignant neoplasm of prostate: Secondary | ICD-10-CM | POA: Diagnosis not present

## 2016-10-25 DIAGNOSIS — Z0000 Encounter for general adult medical examination without abnormal findings: Secondary | ICD-10-CM | POA: Diagnosis not present

## 2016-10-28 DIAGNOSIS — E1121 Type 2 diabetes mellitus with diabetic nephropathy: Secondary | ICD-10-CM | POA: Diagnosis not present

## 2016-10-28 DIAGNOSIS — Z0001 Encounter for general adult medical examination with abnormal findings: Secondary | ICD-10-CM | POA: Diagnosis not present

## 2016-10-28 DIAGNOSIS — E7800 Pure hypercholesterolemia, unspecified: Secondary | ICD-10-CM | POA: Diagnosis not present

## 2016-10-28 DIAGNOSIS — Z23 Encounter for immunization: Secondary | ICD-10-CM | POA: Diagnosis not present

## 2016-10-28 DIAGNOSIS — N182 Chronic kidney disease, stage 2 (mild): Secondary | ICD-10-CM | POA: Diagnosis not present

## 2016-10-28 DIAGNOSIS — E119 Type 2 diabetes mellitus without complications: Secondary | ICD-10-CM | POA: Diagnosis not present

## 2016-10-28 DIAGNOSIS — I10 Essential (primary) hypertension: Secondary | ICD-10-CM | POA: Diagnosis not present

## 2016-11-02 DIAGNOSIS — N200 Calculus of kidney: Secondary | ICD-10-CM | POA: Diagnosis not present

## 2016-11-07 DIAGNOSIS — N200 Calculus of kidney: Secondary | ICD-10-CM | POA: Diagnosis not present

## 2016-12-07 DIAGNOSIS — N200 Calculus of kidney: Secondary | ICD-10-CM | POA: Diagnosis not present

## 2016-12-16 DIAGNOSIS — N200 Calculus of kidney: Secondary | ICD-10-CM | POA: Diagnosis not present

## 2016-12-19 DIAGNOSIS — N183 Chronic kidney disease, stage 3 (moderate): Secondary | ICD-10-CM | POA: Diagnosis not present

## 2016-12-23 DIAGNOSIS — N200 Calculus of kidney: Secondary | ICD-10-CM | POA: Diagnosis not present

## 2016-12-23 DIAGNOSIS — I129 Hypertensive chronic kidney disease with stage 1 through stage 4 chronic kidney disease, or unspecified chronic kidney disease: Secondary | ICD-10-CM | POA: Diagnosis not present

## 2016-12-23 DIAGNOSIS — E1122 Type 2 diabetes mellitus with diabetic chronic kidney disease: Secondary | ICD-10-CM | POA: Diagnosis not present

## 2016-12-23 DIAGNOSIS — N183 Chronic kidney disease, stage 3 (moderate): Secondary | ICD-10-CM | POA: Diagnosis not present

## 2017-02-23 DIAGNOSIS — E119 Type 2 diabetes mellitus without complications: Secondary | ICD-10-CM | POA: Diagnosis not present

## 2017-02-23 DIAGNOSIS — Z1211 Encounter for screening for malignant neoplasm of colon: Secondary | ICD-10-CM | POA: Diagnosis not present

## 2017-02-23 DIAGNOSIS — Z7984 Long term (current) use of oral hypoglycemic drugs: Secondary | ICD-10-CM | POA: Diagnosis not present

## 2017-02-27 DIAGNOSIS — Z1211 Encounter for screening for malignant neoplasm of colon: Secondary | ICD-10-CM | POA: Diagnosis not present

## 2017-04-05 DIAGNOSIS — Z87442 Personal history of urinary calculi: Secondary | ICD-10-CM | POA: Diagnosis not present

## 2017-04-05 DIAGNOSIS — N281 Cyst of kidney, acquired: Secondary | ICD-10-CM | POA: Diagnosis not present

## 2017-04-05 DIAGNOSIS — E790 Hyperuricemia without signs of inflammatory arthritis and tophaceous disease: Secondary | ICD-10-CM | POA: Diagnosis not present

## 2017-04-05 DIAGNOSIS — N200 Calculus of kidney: Secondary | ICD-10-CM | POA: Diagnosis not present

## 2017-04-28 ENCOUNTER — Other Ambulatory Visit (HOSPITAL_COMMUNITY): Payer: Self-pay | Admitting: Family Medicine

## 2017-04-28 ENCOUNTER — Ambulatory Visit (HOSPITAL_COMMUNITY)
Admission: RE | Admit: 2017-04-28 | Discharge: 2017-04-28 | Disposition: A | Discharge status: Home / Self Care | Payer: Medicare Other | Source: Ambulatory Visit | Attending: Vascular Surgery | Admitting: Vascular Surgery

## 2017-04-28 DIAGNOSIS — L97509 Non-pressure chronic ulcer of other part of unspecified foot with unspecified severity: Secondary | ICD-10-CM | POA: Insufficient documentation

## 2017-04-28 DIAGNOSIS — E11621 Type 2 diabetes mellitus with foot ulcer: Secondary | ICD-10-CM | POA: Insufficient documentation

## 2017-04-28 DIAGNOSIS — R609 Edema, unspecified: Secondary | ICD-10-CM | POA: Diagnosis not present

## 2017-04-28 DIAGNOSIS — Z79899 Other long term (current) drug therapy: Secondary | ICD-10-CM | POA: Diagnosis not present

## 2017-04-28 DIAGNOSIS — R600 Localized edema: Secondary | ICD-10-CM | POA: Diagnosis not present

## 2017-04-28 DIAGNOSIS — L97529 Non-pressure chronic ulcer of other part of left foot with unspecified severity: Secondary | ICD-10-CM | POA: Diagnosis not present

## 2017-04-28 DIAGNOSIS — E1152 Type 2 diabetes mellitus with diabetic peripheral angiopathy with gangrene: Secondary | ICD-10-CM | POA: Diagnosis not present

## 2017-05-03 DIAGNOSIS — L97509 Non-pressure chronic ulcer of other part of unspecified foot with unspecified severity: Secondary | ICD-10-CM

## 2017-05-03 HISTORY — DX: Non-pressure chronic ulcer of other part of unspecified foot with unspecified severity: L97.509

## 2017-05-04 ENCOUNTER — Other Ambulatory Visit (HOSPITAL_COMMUNITY): Payer: Self-pay | Admitting: Family Medicine

## 2017-05-04 DIAGNOSIS — L97529 Non-pressure chronic ulcer of other part of left foot with unspecified severity: Secondary | ICD-10-CM

## 2017-05-05 ENCOUNTER — Encounter (HOSPITAL_COMMUNITY): Payer: 59

## 2017-05-05 ENCOUNTER — Ambulatory Visit (HOSPITAL_COMMUNITY)
Admission: RE | Admit: 2017-05-05 | Discharge: 2017-05-05 | Disposition: A | Discharge status: Home / Self Care | Payer: Medicare Other | Source: Ambulatory Visit | Attending: Vascular Surgery | Admitting: Vascular Surgery

## 2017-05-05 DIAGNOSIS — L97509 Non-pressure chronic ulcer of other part of unspecified foot with unspecified severity: Secondary | ICD-10-CM | POA: Insufficient documentation

## 2017-05-05 DIAGNOSIS — R609 Edema, unspecified: Secondary | ICD-10-CM | POA: Insufficient documentation

## 2017-05-05 DIAGNOSIS — L97529 Non-pressure chronic ulcer of other part of left foot with unspecified severity: Secondary | ICD-10-CM

## 2017-05-05 DIAGNOSIS — E11621 Type 2 diabetes mellitus with foot ulcer: Secondary | ICD-10-CM | POA: Insufficient documentation

## 2017-05-10 ENCOUNTER — Encounter (HOSPITAL_BASED_OUTPATIENT_CLINIC_OR_DEPARTMENT_OTHER): Payer: Self-pay

## 2017-05-10 DIAGNOSIS — E11621 Type 2 diabetes mellitus with foot ulcer: Secondary | ICD-10-CM | POA: Diagnosis not present

## 2017-05-10 DIAGNOSIS — L97509 Non-pressure chronic ulcer of other part of unspecified foot with unspecified severity: Secondary | ICD-10-CM | POA: Diagnosis not present

## 2017-05-10 DIAGNOSIS — M86172 Other acute osteomyelitis, left ankle and foot: Secondary | ICD-10-CM | POA: Diagnosis not present

## 2017-05-10 NOTE — Progress Notes (Signed)
James in Dr. Roxana Hires office notified that Pt  will be arriving in the morning at 0530 and needs orders. He stated that she is aware and he will remind her.

## 2017-05-10 NOTE — Pre-Procedure Instructions (Addendum)
Spoke with Timothy Ray he will fax H&P form to Dr. Dorthy Cooler.  I gave him their correct fax number per the office [phone].  I also informed him that asked that the form be marked "urgent."  Timothy Ray said he would fax the form to their office.

## 2017-05-10 NOTE — Progress Notes (Signed)
Spoke with: "Zigmund Daniel" (wife) unable to speak with Dr. Milas Gain he was at work wife states I would not be able to speak with him until after 7PM  NPO:  After Midnight, no gum, candy, or mints   Arrival time:  83AM Labs/procedures:  Istat8, EKG AM medications:  Amlodipine, Atorvastatin, Losartan, Bystolic, Omeprazole (instructed wife if these are the medications he usually takes in the morning) Pre op orders: No Ride home:  Zigmund Daniel (wife) [phone]

## 2017-05-10 NOTE — Pre-Procedure Instructions (Addendum)
Spoke with Jeneen Rinks to check on the H&P, he has not received it yet from Dr. Dorthy Cooler. Jeneen Rinks is going to call the office again.  I verified our faxe number with him [phone] and left alternate fax number if he gets the information after 5:00PM [phone].

## 2017-05-11 ENCOUNTER — Encounter (HOSPITAL_BASED_OUTPATIENT_CLINIC_OR_DEPARTMENT_OTHER)
Admission: RE | Disposition: A | Discharge status: Home / Self Care | Payer: Self-pay | Source: Ambulatory Visit | Attending: Podiatry

## 2017-05-11 ENCOUNTER — Ambulatory Visit (HOSPITAL_BASED_OUTPATIENT_CLINIC_OR_DEPARTMENT_OTHER)
Admission: RE | Admit: 2017-05-11 | Discharge: 2017-05-11 | Disposition: A | Discharge status: Home / Self Care | Payer: Medicare Other | Source: Ambulatory Visit | Attending: Podiatry | Admitting: Podiatry

## 2017-05-11 ENCOUNTER — Ambulatory Visit (HOSPITAL_BASED_OUTPATIENT_CLINIC_OR_DEPARTMENT_OTHER): Payer: Medicare Other | Admitting: Anesthesiology

## 2017-05-11 ENCOUNTER — Encounter (HOSPITAL_BASED_OUTPATIENT_CLINIC_OR_DEPARTMENT_OTHER): Payer: Self-pay

## 2017-05-11 DIAGNOSIS — N183 Chronic kidney disease, stage 3 (moderate): Secondary | ICD-10-CM | POA: Diagnosis not present

## 2017-05-11 DIAGNOSIS — E1169 Type 2 diabetes mellitus with other specified complication: Secondary | ICD-10-CM | POA: Insufficient documentation

## 2017-05-11 DIAGNOSIS — I129 Hypertensive chronic kidney disease with stage 1 through stage 4 chronic kidney disease, or unspecified chronic kidney disease: Secondary | ICD-10-CM | POA: Diagnosis not present

## 2017-05-11 DIAGNOSIS — I10 Essential (primary) hypertension: Secondary | ICD-10-CM | POA: Diagnosis not present

## 2017-05-11 DIAGNOSIS — M869 Osteomyelitis, unspecified: Secondary | ICD-10-CM | POA: Diagnosis not present

## 2017-05-11 DIAGNOSIS — M86172 Other acute osteomyelitis, left ankle and foot: Secondary | ICD-10-CM | POA: Diagnosis not present

## 2017-05-11 HISTORY — DX: Chronic kidney disease, stage 3 unspecified: N18.30

## 2017-05-11 HISTORY — DX: Chronic kidney disease, stage 3 (moderate): N18.3

## 2017-05-11 HISTORY — DX: Cyst of kidney, acquired: N28.1

## 2017-05-11 HISTORY — DX: Calculus of kidney: N20.0

## 2017-05-11 HISTORY — PX: AMPUTATION TOE: SHX6595

## 2017-05-11 HISTORY — DX: Non-pressure chronic ulcer of other part of unspecified foot with unspecified severity: L97.509

## 2017-05-11 LAB — POCT I-STAT, CHEM 8
BUN: 27 mg/dL — AB (ref 6–20)
CHLORIDE: 100 mmol/L — AB (ref 101–111)
CREATININE: 1.5 mg/dL — AB (ref 0.61–1.24)
Calcium, Ion: 1.22 mmol/L (ref 1.15–1.40)
Glucose, Bld: 115 mg/dL — ABNORMAL HIGH (ref 65–99)
HCT: 38 % — ABNORMAL LOW (ref 39.0–52.0)
Hemoglobin: 12.9 g/dL — ABNORMAL LOW (ref 13.0–17.0)
Potassium: 4.4 mmol/L (ref 3.5–5.1)
SODIUM: 138 mmol/L (ref 135–145)
TCO2: 26 mmol/L (ref 22–32)

## 2017-05-11 LAB — GLUCOSE, CAPILLARY: Glucose-Capillary: 120 mg/dL — ABNORMAL HIGH (ref 65–99)

## 2017-05-11 SURGERY — AMPUTATION, TOE
Anesthesia: Monitor Anesthesia Care | Laterality: Left

## 2017-05-11 MED ORDER — BUPIVACAINE HCL 0.5 % IJ SOLN
INTRAMUSCULAR | Status: DC | PRN
  Administered 2017-05-11: 5 mL

## 2017-05-11 MED ORDER — CLINDAMYCIN PHOSPHATE 600 MG/50ML IV SOLN
INTRAVENOUS | Status: DC | PRN
  Administered 2017-05-11: 600 mg via INTRAVENOUS

## 2017-05-11 MED ORDER — SODIUM CHLORIDE 0.9% FLUSH
3.0000 mL | INTRAVENOUS | Status: DC | PRN
  Filled 2017-05-11: qty 3

## 2017-05-11 MED ORDER — LIDOCAINE 2% (20 MG/ML) 5 ML SYRINGE
INTRAMUSCULAR | Status: AC
  Filled 2017-05-11: qty 5

## 2017-05-11 MED ORDER — FENTANYL CITRATE (PF) 100 MCG/2ML IJ SOLN
INTRAMUSCULAR | Status: AC
  Filled 2017-05-11: qty 2

## 2017-05-11 MED ORDER — MIDAZOLAM HCL 5 MG/5ML IJ SOLN
INTRAMUSCULAR | Status: DC | PRN
  Administered 2017-05-11: 2 mg via INTRAVENOUS

## 2017-05-11 MED ORDER — SODIUM CHLORIDE 0.9% FLUSH
3.0000 mL | Freq: Two times a day (BID) | INTRAVENOUS | Status: DC
  Filled 2017-05-11: qty 3

## 2017-05-11 MED ORDER — LACTATED RINGERS IV SOLN
INTRAVENOUS | Status: DC
  Filled 2017-05-11: qty 1000

## 2017-05-11 MED ORDER — CHLORHEXIDINE GLUCONATE 4 % EX LIQD
1.0000 "application " | Freq: Once | CUTANEOUS | Status: DC
  Filled 2017-05-11: qty 15

## 2017-05-11 MED ORDER — LIDOCAINE 2% (20 MG/ML) 5 ML SYRINGE
INTRAMUSCULAR | Status: DC | PRN
  Administered 2017-05-11: 100 mg via INTRAVENOUS

## 2017-05-11 MED ORDER — HYDROCODONE-ACETAMINOPHEN 5-325 MG PO TABS
1.0000 | ORAL_TABLET | ORAL | Status: DC | PRN
  Filled 2017-05-11: qty 1

## 2017-05-11 MED ORDER — CLINDAMYCIN PHOSPHATE 600 MG/50ML IV SOLN
INTRAVENOUS | Status: AC
  Filled 2017-05-11: qty 50

## 2017-05-11 MED ORDER — PROMETHAZINE HCL 25 MG/ML IJ SOLN
6.2500 mg | INTRAMUSCULAR | Status: DC | PRN
  Filled 2017-05-11: qty 1

## 2017-05-11 MED ORDER — LIDOCAINE HCL 2 % IJ SOLN
INTRAMUSCULAR | Status: DC | PRN
  Administered 2017-05-11: 5 mL

## 2017-05-11 MED ORDER — SODIUM CHLORIDE 0.9 % IV SOLN
INTRAVENOUS | Status: DC
  Administered 2017-05-11: 1000 mL via INTRAVENOUS
  Filled 2017-05-11: qty 1000

## 2017-05-11 MED ORDER — MIDAZOLAM HCL 2 MG/2ML IJ SOLN
INTRAMUSCULAR | Status: AC
  Filled 2017-05-11: qty 2

## 2017-05-11 MED ORDER — SODIUM CHLORIDE 0.9 % IV SOLN
250.0000 mL | INTRAVENOUS | Status: DC | PRN
  Filled 2017-05-11: qty 250

## 2017-05-11 MED ORDER — ACETAMINOPHEN 325 MG PO TABS
650.0000 mg | ORAL_TABLET | ORAL | Status: DC | PRN
  Filled 2017-05-11: qty 2

## 2017-05-11 MED ORDER — FENTANYL CITRATE (PF) 100 MCG/2ML IJ SOLN
INTRAMUSCULAR | Status: DC | PRN
  Administered 2017-05-11: 50 ug via INTRAVENOUS

## 2017-05-11 MED ORDER — FENTANYL CITRATE (PF) 100 MCG/2ML IJ SOLN
25.0000 ug | INTRAMUSCULAR | Status: DC | PRN
  Filled 2017-05-11: qty 1

## 2017-05-11 MED ORDER — PROPOFOL 500 MG/50ML IV EMUL
INTRAVENOUS | Status: DC | PRN
  Administered 2017-05-11: 50 ug/kg/min via INTRAVENOUS

## 2017-05-11 MED ORDER — PROPOFOL 500 MG/50ML IV EMUL
INTRAVENOUS | Status: AC
  Filled 2017-05-11: qty 50

## 2017-05-11 MED FILL — HYDROCODON-APAP 5-325: 5-325 | 2 days supply | Qty: 12 | Fill #0

## 2017-05-11 SURGICAL SUPPLY — 55 items
BANDAGE CO FLEX L/F 2IN X 5YD (GAUZE/BANDAGES/DRESSINGS) ×3 IMPLANT
BANDAGE COBAN STERILE 2 (GAUZE/BANDAGES/DRESSINGS) ×3 IMPLANT
BANDAGE ELASTIC 6 VELCRO ST LF (GAUZE/BANDAGES/DRESSINGS) ×3 IMPLANT
BENZOIN TINCTURE PRP APPL 2/3 (GAUZE/BANDAGES/DRESSINGS) ×3 IMPLANT
BLADE AVERAGE 25MMX9MM (BLADE) ×1
BLADE AVERAGE 25X9 (BLADE) ×2 IMPLANT
BLADE SURG 15 STRL LF DISP TIS (BLADE) ×4 IMPLANT
BLADE SURG 15 STRL SS (BLADE) ×8
BNDG CONFORM 2 STRL LF (GAUZE/BANDAGES/DRESSINGS) ×6 IMPLANT
BNDG ESMARK 4X9 LF (GAUZE/BANDAGES/DRESSINGS) ×3 IMPLANT
CLOSURE WOUND 1/4X4 (GAUZE/BANDAGES/DRESSINGS) ×3
COVER BACK TABLE 60X90IN (DRAPES) ×3 IMPLANT
COVER MAYO STAND STRL (DRAPES) ×3 IMPLANT
DRAPE EXTREMITY T 121X128X90 (DRAPE) ×3 IMPLANT
DRAPE LG THREE QUARTER DISP (DRAPES) ×3 IMPLANT
DURAPREP 26ML APPLICATOR (WOUND CARE) ×3 IMPLANT
ELECT REM PT RETURN 9FT ADLT (ELECTROSURGICAL) ×3
ELECTRODE REM PT RTRN 9FT ADLT (ELECTROSURGICAL) ×1 IMPLANT
GAUZE SPONGE 4X4 12PLY STRL (GAUZE/BANDAGES/DRESSINGS) ×3 IMPLANT
GAUZE SPONGE 4X4 16PLY XRAY LF (GAUZE/BANDAGES/DRESSINGS) IMPLANT
GAUZE XEROFORM 1X8 LF (GAUZE/BANDAGES/DRESSINGS) ×3 IMPLANT
GLOVE BIO SURGEON STRL SZ7.5 (GLOVE) ×3 IMPLANT
GLOVE INDICATOR 7.5 STRL GRN (GLOVE) ×3 IMPLANT
GOWN STRL REUS W/TWL LRG LVL3 (GOWN DISPOSABLE) ×3 IMPLANT
KIT TURNOVER CYSTO (KITS) ×3 IMPLANT
LOOP VESSEL MAXI BLUE (MISCELLANEOUS) IMPLANT
NDL SAFETY ECLIPSE 18X1.5 (NEEDLE) ×1 IMPLANT
NEEDLE 27GAX1X1/2 (NEEDLE) ×3 IMPLANT
NEEDLE HYPO 18GX1.5 SHARP (NEEDLE) ×2
NEEDLE HYPO 22GX1.5 SAFETY (NEEDLE) ×3 IMPLANT
NS IRRIG 500ML POUR BTL (IV SOLUTION) ×3 IMPLANT
PACK BASIN DAY SURGERY FS (CUSTOM PROCEDURE TRAY) ×3 IMPLANT
PADDING CAST ABS 4INX4YD NS (CAST SUPPLIES) ×2
PADDING CAST ABS COTTON 4X4 ST (CAST SUPPLIES) ×1 IMPLANT
PENCIL BUTTON HOLSTER BLD 10FT (ELECTRODE) ×3 IMPLANT
RASP SM TEAR CROSS CUT (RASP) ×3 IMPLANT
SPONGE LAP 4X18 X RAY DECT (DISPOSABLE) ×3 IMPLANT
STOCKINETTE 6  STRL (DRAPES) ×2
STOCKINETTE 6 STRL (DRAPES) ×1 IMPLANT
STRIP CLOSURE SKIN 1/4X4 (GAUZE/BANDAGES/DRESSINGS) ×6 IMPLANT
SUCTION FRAZIER HANDLE 10FR (MISCELLANEOUS) ×2
SUCTION TUBE FRAZIER 10FR DISP (MISCELLANEOUS) ×1 IMPLANT
SUT ETHILON 4 0 PS 2 18 (SUTURE) IMPLANT
SUT ETHILON 5 0 PS 2 18 (SUTURE) IMPLANT
SUT MNCRL AB 4-0 PS2 18 (SUTURE) ×3 IMPLANT
SUT VIC AB 3-0 SH 27 (SUTURE) ×2
SUT VIC AB 3-0 SH 27X BRD (SUTURE) ×1 IMPLANT
SUT VICRYL 4-0 PS2 18IN ABS (SUTURE) IMPLANT
SYR 3ML 18GX1 1/2 (SYRINGE) ×3 IMPLANT
SYR BULB 3OZ (MISCELLANEOUS) ×3 IMPLANT
SYR CONTROL 10ML LL (SYRINGE) ×6 IMPLANT
TUBE CONNECTING 12'X1/4 (SUCTIONS) ×1
TUBE CONNECTING 12X1/4 (SUCTIONS) ×2 IMPLANT
UNDERPAD 30X30 (UNDERPADS AND DIAPERS) ×3 IMPLANT
WATER STERILE IRR 500ML POUR (IV SOLUTION) ×3 IMPLANT

## 2017-05-11 NOTE — Transfer of Care (Signed)
Immediate Anesthesia Transfer of Care Note  Patient: [name] [facility]  Procedure(s) Performed: AMPUTATION LEFT THIRD TOE (Left )  Patient Location: PACU  Anesthesia Type:MAC  Level of Consciousness: awake, alert  and oriented  Airway & Oxygen Therapy: Patient Spontanous Breathing  Post-op Assessment: Report given to RN and Post -op Vital signs reviewed and stable  Post vital signs: Reviewed and stable  Last Vitals:  Vitals Value Taken Time  BP 123/67 05/11/2017  8:24 AM  Temp    Pulse 64 05/11/2017  8:26 AM  Resp 14 05/11/2017  8:26 AM  SpO2 97 % 05/11/2017  8:26 AM  Vitals shown include unvalidated device data.  Last Pain:  Vitals:   05/11/17 0633  TempSrc:   PainSc: 1       Patients Stated Pain Goal: 4 (12/25/39 1464)  Complications: No apparent anesthesia complications

## 2017-05-11 NOTE — Anesthesia Procedure Notes (Signed)
Procedure Name: MAC Date/Time: 05/11/2017 7:35 AM Performed by: Bonney Aid, CRNA Pre-anesthesia Checklist: Patient identified, Timeout performed, Emergency Drugs available, Suction available and Patient being monitored Patient Re-evaluated:Patient Re-evaluated prior to induction Oxygen Delivery Method: Nasal cannula Placement Confirmation: positive ETCO2

## 2017-05-11 NOTE — Anesthesia Postprocedure Evaluation (Signed)
Anesthesia Post Note  Patient: Kolbey Teichert [facility]  Procedure(s) Performed: AMPUTATION LEFT THIRD TOE (Left )     Patient location during evaluation: PACU Anesthesia Type: MAC Level of consciousness: awake and alert Pain management: pain level controlled Vital Signs Assessment: post-procedure vital signs reviewed and stable Respiratory status: spontaneous breathing, nonlabored ventilation, respiratory function stable and patient connected to nasal cannula oxygen Cardiovascular status: stable and blood pressure returned to baseline Postop Assessment: no apparent nausea or vomiting Anesthetic complications: no    Last Vitals:  Vitals:   05/11/17 0824 05/11/17 0845  BP: 123/67 124/64  Pulse: 65 (!) 58  Resp: 16 13  Temp: 36.4 C   SpO2: 96% 99%    Last Pain:  Vitals:   05/11/17 0900  TempSrc:   PainSc: 0-No pain                 Lachanda Buczek S

## 2017-05-11 NOTE — Discharge Instructions (Signed)
Call your surgeon if you experience:   1.  Fever over 101.0. 2.  Nausea and/or vomiting. 3.  Extreme swelling or bruising at the surgical site. 4.  Continued bleeding from the incision. 5.  Increased pain, redness or drainage from the incision. 6.  Problems related to your pain medication. 7.  Any problems and/or concerns  Post Anesthesia Home Care Instructions  Activity: Get plenty of rest for the remainder of the day. A responsible individual must stay with you for 24 hours following the procedure.  For the next 24 hours, DO NOT: -Drive a car -Paediatric nurse -Drink alcoholic beverages -Take any medication unless instructed by your physician -Make any legal decisions or sign important papers.  Meals: Start with liquid foods such as gelatin or soup. Progress to regular foods as tolerated. Avoid greasy, spicy, heavy foods. If nausea and/or vomiting occur, drink only clear liquids until the nausea and/or vomiting subsides. Call your physician if vomiting continues.  Special Instructions/Symptoms: Your throat may feel dry or sore from the anesthesia or the breathing tube placed in your throat during surgery. If this causes discomfort, gargle with warm salt water. The discomfort should disappear within 24 hours.  If you had a scopolamine patch placed behind your ear for the management of post- operative nausea and/or vomiting:  1. The medication in the patch is effective for 72 hours, after which it should be removed.  Wrap patch in a tissue and discard in the trash. Wash hands thoroughly with soap and water. 2. You may remove the patch earlier than 72 hours if you experience unpleasant side effects which may include dry mouth, dizziness or visual disturbances. 3. Avoid touching the patch. Wash your hands with soap and water after contact with the patch.

## 2017-05-11 NOTE — Anesthesia Preprocedure Evaluation (Signed)
Anesthesia Evaluation  Patient identified by MRN, date of birth, ID band Patient awake    Reviewed: Allergy & Precautions, NPO status , Patient's Chart, lab work & pertinent test results  Airway Mallampati: II  TM Distance: >3 FB Neck ROM: Full    Dental no notable dental hx.    Pulmonary neg pulmonary ROS,    Pulmonary exam normal breath sounds clear to auscultation       Cardiovascular hypertension, Normal cardiovascular exam Rhythm:Regular Rate:Normal     Neuro/Psych negative neurological ROS  negative psych ROS   GI/Hepatic negative GI ROS, Neg liver ROS,   Endo/Other  diabetes  Renal/GU negative Renal ROS  negative genitourinary   Musculoskeletal negative musculoskeletal ROS (+)   Abdominal   Peds negative pediatric ROS (+)  Hematology negative hematology ROS (+)   Anesthesia Other Findings   Reproductive/Obstetrics negative OB ROS                             Anesthesia Physical Anesthesia Plan  ASA: II  Anesthesia Plan: MAC   Post-op Pain Management:    Induction: Intravenous  PONV Risk Score and Plan: 1 and Ondansetron  Airway Management Planned: Simple Face Mask  Additional Equipment:   Intra-op Plan:   Post-operative Plan:   Informed Consent: I have reviewed the patients History and Physical, chart, labs and discussed the procedure including the risks, benefits and alternatives for the proposed anesthesia with the patient or authorized representative who has indicated his/her understanding and acceptance.   Dental advisory given  Plan Discussed with: CRNA and Surgeon  Anesthesia Plan Comments:         Anesthesia Quick Evaluation

## 2017-05-12 LAB — ACID FAST SMEAR (AFB, MYCOBACTERIA)

## 2017-05-12 LAB — ACID FAST SMEAR (AFB): ACID FAST SMEAR - AFSCU2: NEGATIVE

## 2017-05-16 DIAGNOSIS — M86172 Other acute osteomyelitis, left ankle and foot: Secondary | ICD-10-CM | POA: Diagnosis not present

## 2017-05-16 LAB — AEROBIC/ANAEROBIC CULTURE (SURGICAL/DEEP WOUND)

## 2017-05-16 LAB — AEROBIC/ANAEROBIC CULTURE W GRAM STAIN (SURGICAL/DEEP WOUND): Gram Stain: NONE SEEN

## 2017-05-23 NOTE — Op Note (Signed)
NAMELOCKLAN, Timothy Ray MEDICAL RECORD SK:[id number] ACCOUNT [id number] DATE OF BIRTH:1950/08/08 FACILITY: [facility] LOCATION: [facility] PHYSICIAN:Aleesha Ringstad, DPM  OPERATIVE REPORT  DATE OF PROCEDURE:  05/11/2017  SURGEON:  Rosemary Holms, DPM  ASSISTANT:  None.  PREOPERATIVE DIAGNOSIS:  Third toe osteomyelitis.  POSTOPERATIVE DIAGNOSIS:  Third toe osteomyelitis.  PROCEDURE:  Partial digital amputation of the 3rd left toe at the level of the distal interphalangeal joint.  ANESTHESIA:  MAC with local.  COMPLICATIONS:  None.  DESCRIPTION OF PROCEDURE:  The patient was brought to the OR and placed in the supine position, at which time monitored anesthesia care was administered and local block was performed with a 1:1 mixture of 0.5% Marcaine plain and lidocaine plain.  Foot  was draped and prepped in the usual aseptic manner.  Attention was directed to the third toe at the level of the distal interphalangeal joint where a fishmouth incision was made.  The incision was deepened via sharp and blunt modalities, taking care to  clamp and cauterize all bleeding vessels and ensuring retraction of neurovascular structures encountered.  Dissection was carried to the level of the distal interphalangeal joint  at the level of the toe amputation and great care was taken to  disarticulate the toe at this level.  The area was irrigated with copious amounts of sterile saline and antibiotic solution.  Deep closure with 4-0 Vicryl and skin closed with 5-0 nylon.  The foot was dressed with Xeroform, 4 x 4's, Kling and Coban.  The tourniquet was deflated.    While the patient was still in the room, time was taken to evaluate the specimen and resect the bone such that the distal half of the distal phalanx was resected for pathology as infection and then the proximal portions sent to  pathology to evaluate as clean specimen.  Cultures and sensitivities were also taken during this time.  The patient was  then taken to the recovery room with vital signs stable and capillary refill time at presurgical levels.  Both written and oral  postoperative instructions were given to the patient.  No guarantees given.  AN/NUANCE  D:05/22/2017 T:05/22/2017 JOB:[id number]

## 2017-05-30 DIAGNOSIS — E1165 Type 2 diabetes mellitus with hyperglycemia: Secondary | ICD-10-CM | POA: Diagnosis not present

## 2017-05-30 DIAGNOSIS — E1152 Type 2 diabetes mellitus with diabetic peripheral angiopathy with gangrene: Secondary | ICD-10-CM | POA: Diagnosis not present

## 2017-05-30 DIAGNOSIS — Z7984 Long term (current) use of oral hypoglycemic drugs: Secondary | ICD-10-CM | POA: Diagnosis not present

## 2017-06-01 DIAGNOSIS — L603 Nail dystrophy: Secondary | ICD-10-CM | POA: Diagnosis not present

## 2017-06-01 DIAGNOSIS — E1351 Other specified diabetes mellitus with diabetic peripheral angiopathy without gangrene: Secondary | ICD-10-CM | POA: Diagnosis not present

## 2017-06-01 DIAGNOSIS — M205X2 Other deformities of toe(s) (acquired), left foot: Secondary | ICD-10-CM | POA: Diagnosis not present

## 2017-06-01 DIAGNOSIS — L602 Onychogryphosis: Secondary | ICD-10-CM | POA: Diagnosis not present

## 2017-06-09 DIAGNOSIS — L603 Nail dystrophy: Secondary | ICD-10-CM | POA: Diagnosis not present

## 2017-06-27 LAB — ACID FAST CULTURE WITH REFLEXED SENSITIVITIES

## 2017-06-27 LAB — ACID FAST CULTURE WITH REFLEXED SENSITIVITIES (MYCOBACTERIA): Acid Fast Culture: NEGATIVE

## 2017-07-21 DIAGNOSIS — M205X2 Other deformities of toe(s) (acquired), left foot: Secondary | ICD-10-CM | POA: Diagnosis not present

## 2017-07-21 DIAGNOSIS — E1140 Type 2 diabetes mellitus with diabetic neuropathy, unspecified: Secondary | ICD-10-CM | POA: Diagnosis not present

## 2017-07-21 DIAGNOSIS — L97529 Non-pressure chronic ulcer of other part of left foot with unspecified severity: Secondary | ICD-10-CM | POA: Diagnosis not present

## 2017-07-21 DIAGNOSIS — M2042 Other hammer toe(s) (acquired), left foot: Secondary | ICD-10-CM | POA: Diagnosis not present

## 2017-08-01 DIAGNOSIS — R609 Edema, unspecified: Secondary | ICD-10-CM | POA: Diagnosis not present

## 2017-08-01 DIAGNOSIS — M2042 Other hammer toe(s) (acquired), left foot: Secondary | ICD-10-CM | POA: Diagnosis not present

## 2017-08-01 DIAGNOSIS — M205X2 Other deformities of toe(s) (acquired), left foot: Secondary | ICD-10-CM | POA: Diagnosis not present

## 2017-08-01 DIAGNOSIS — L97529 Non-pressure chronic ulcer of other part of left foot with unspecified severity: Secondary | ICD-10-CM | POA: Diagnosis not present

## 2017-08-18 DIAGNOSIS — M2042 Other hammer toe(s) (acquired), left foot: Secondary | ICD-10-CM | POA: Diagnosis not present

## 2017-08-18 DIAGNOSIS — L84 Corns and callosities: Secondary | ICD-10-CM | POA: Diagnosis not present

## 2017-08-18 DIAGNOSIS — E1140 Type 2 diabetes mellitus with diabetic neuropathy, unspecified: Secondary | ICD-10-CM | POA: Diagnosis not present

## 2017-08-18 DIAGNOSIS — M205X2 Other deformities of toe(s) (acquired), left foot: Secondary | ICD-10-CM | POA: Diagnosis not present

## 2017-08-18 DIAGNOSIS — R609 Edema, unspecified: Secondary | ICD-10-CM | POA: Diagnosis not present

## 2017-08-18 DIAGNOSIS — L97529 Non-pressure chronic ulcer of other part of left foot with unspecified severity: Secondary | ICD-10-CM | POA: Diagnosis not present

## 2017-08-18 DIAGNOSIS — L602 Onychogryphosis: Secondary | ICD-10-CM | POA: Diagnosis not present

## 2017-08-23 DIAGNOSIS — N182 Chronic kidney disease, stage 2 (mild): Secondary | ICD-10-CM | POA: Diagnosis not present

## 2017-08-23 DIAGNOSIS — E1121 Type 2 diabetes mellitus with diabetic nephropathy: Secondary | ICD-10-CM | POA: Diagnosis not present

## 2017-08-23 DIAGNOSIS — E7800 Pure hypercholesterolemia, unspecified: Secondary | ICD-10-CM | POA: Diagnosis not present

## 2017-08-25 DIAGNOSIS — I872 Venous insufficiency (chronic) (peripheral): Secondary | ICD-10-CM | POA: Diagnosis not present

## 2017-08-25 DIAGNOSIS — E1121 Type 2 diabetes mellitus with diabetic nephropathy: Secondary | ICD-10-CM | POA: Diagnosis not present

## 2017-08-25 DIAGNOSIS — Z7984 Long term (current) use of oral hypoglycemic drugs: Secondary | ICD-10-CM | POA: Diagnosis not present

## 2017-08-25 DIAGNOSIS — N183 Chronic kidney disease, stage 3 (moderate): Secondary | ICD-10-CM | POA: Diagnosis not present

## 2017-09-08 DIAGNOSIS — R609 Edema, unspecified: Secondary | ICD-10-CM | POA: Diagnosis not present

## 2017-09-08 DIAGNOSIS — L97529 Non-pressure chronic ulcer of other part of left foot with unspecified severity: Secondary | ICD-10-CM | POA: Diagnosis not present

## 2017-09-08 DIAGNOSIS — M205X2 Other deformities of toe(s) (acquired), left foot: Secondary | ICD-10-CM | POA: Diagnosis not present

## 2017-09-08 DIAGNOSIS — M2042 Other hammer toe(s) (acquired), left foot: Secondary | ICD-10-CM | POA: Diagnosis not present

## 2017-09-11 DIAGNOSIS — N183 Chronic kidney disease, stage 3 (moderate): Secondary | ICD-10-CM | POA: Diagnosis not present

## 2017-09-12 ENCOUNTER — Other Ambulatory Visit: Payer: Self-pay

## 2017-09-12 DIAGNOSIS — R609 Edema, unspecified: Secondary | ICD-10-CM

## 2017-09-15 DIAGNOSIS — E1122 Type 2 diabetes mellitus with diabetic chronic kidney disease: Secondary | ICD-10-CM | POA: Diagnosis not present

## 2017-09-15 DIAGNOSIS — I129 Hypertensive chronic kidney disease with stage 1 through stage 4 chronic kidney disease, or unspecified chronic kidney disease: Secondary | ICD-10-CM | POA: Diagnosis not present

## 2017-09-15 DIAGNOSIS — R600 Localized edema: Secondary | ICD-10-CM | POA: Diagnosis not present

## 2017-09-15 DIAGNOSIS — N183 Chronic kidney disease, stage 3 (moderate): Secondary | ICD-10-CM | POA: Diagnosis not present

## 2017-10-05 ENCOUNTER — Ambulatory Visit (INDEPENDENT_AMBULATORY_CARE_PROVIDER_SITE_OTHER): Payer: Medicare Other | Admitting: Vascular Surgery

## 2017-10-05 ENCOUNTER — Ambulatory Visit (HOSPITAL_COMMUNITY)
Admission: RE | Admit: 2017-10-05 | Discharge: 2017-10-05 | Disposition: A | Discharge status: Home / Self Care | Payer: Medicare Other | Source: Ambulatory Visit | Attending: Vascular Surgery | Admitting: Vascular Surgery

## 2017-10-05 ENCOUNTER — Encounter: Payer: Self-pay | Admitting: Vascular Surgery

## 2017-10-05 VITALS — BP 149/69 | HR 72 | Temp 98.3°F | Resp 18 | Ht 68.75 in | Wt 201.6 lb

## 2017-10-05 DIAGNOSIS — R609 Edema, unspecified: Secondary | ICD-10-CM

## 2017-10-05 DIAGNOSIS — I83812 Varicose veins of left lower extremities with pain: Secondary | ICD-10-CM | POA: Diagnosis not present

## 2017-10-05 NOTE — Progress Notes (Signed)
Referring Physician: Dr Dorthy Cooler  Patient name: Timothy Ray MRN: [id number] DOB: 01-17-1950 Sex: male  REASON FOR CONSULT: Left leg swelling  HPI: Timothy Ray is a 67 y.o. male, with a several year history of left leg swelling.  This began after a long flight in his left leg.  It has been worse over the last 6 to 8 months.  He denies claudication symptoms.  He did have infection in the tip of his third toe recently and had partial amputation of this.  He denies family history of varicose veins.  He is a former smoker but quit years ago.  Other medical problems include CKD 3, diabetes, hypertension all of which are currently stable.  He is on aspirin and statin.  Past Medical History:  Diagnosis Date  . CKD (chronic kidney disease), stage III ([place])   . Diabetes mellitus without complication ([place])    type 2  . Foot ulcer ([place])   . History of kidney stones    2018  . Hypertension   . Nephrolithiasis, uric acid   . Renal cyst, left   . Toe ulcer ([place]) 05/2017   Left   Past Surgical History:  Procedure Laterality Date  . AMPUTATION TOE Left 05/11/2017   Procedure: AMPUTATION LEFT THIRD TOE;  Surgeon: Rosemary Holms, DPM;  Location: [facility];  Service: Podiatry;  Laterality: Left;  . APPENDECTOMY     67 years old  . COLONOSCOPY    . IR URETERAL STENT LEFT NEW ACCESS W/O SEP NEPHROSTOMY CATH  10/13/2016  . NEPHROLITHOTOMY Left 10/13/2016   Procedure: NEPHROLITHOTOMY PERCUTANEOUS;  Surgeon: Irine Seal, MD;  Location: [facility];  Service: Urology;  Laterality: Left;  ONLY NEEDS 90 MINUTES FOR PROCEDURE    History reviewed. No pertinent family history.  SOCIAL HISTORY: Social History   Socioeconomic History  . Marital status: Married    Spouse name: Not on file  . Number of children: Not on file  . Years of education: Not on file  . Highest education level: Not on file  Occupational History  . Not on file  Social Needs  . Financial resource  strain: Not on file  . Food insecurity:    Worry: Not on file    Inability: Not on file  . Transportation needs:    Medical: Not on file    Non-medical: Not on file  Tobacco Use  . Smoking status: Never Smoker  . Smokeless tobacco: Never Used  . Tobacco comment: social in college  Substance and Sexual Activity  . Alcohol use: Yes    Comment: occasional not in last 2 weeks  . Drug use: No  . Sexual activity: Yes  Lifestyle  . Physical activity:    Days per week: Not on file    Minutes per session: Not on file  . Stress: Not on file  Relationships  . Social connections:    Talks on phone: Not on file    Gets together: Not on file    Attends religious service: Not on file    Active member of club or organization: Not on file    Attends meetings of clubs or organizations: Not on file    Relationship status: Not on file  . Intimate partner violence:    Fear of current or ex partner: Not on file    Emotionally abused: Not on file    Physically abused: Not on file    Forced sexual activity: Not on file  Other Topics Concern  . Not on file  Social History Narrative  . Not on file    Allergies  Allergen Reactions  . Penicillins Rash    Has patient had a PCN reaction causing immediate rash, facial/tongue/throat swelling, SOB or lightheadedness with hypotension: Yes Has patient had a PCN reaction causing severe rash involving mucus membranes or skin necrosis: No Has patient had a PCN reaction that required hospitalization: Yes - was already in the hospital when reaction happened Has patient had a PCN reaction occurring within the last 10 years: No If all of the above answers are "NO", then may proceed with Cephalosporin use.     Current Outpatient Medications  Medication Sig Dispense Refill  . acetaminophen (TYLENOL) 650 MG CR tablet Take 1,300 mg by mouth every 8 (eight) hours.     Marland Kitchen allopurinol (ZYLOPRIM) 100 MG tablet Take 100 mg by mouth daily.    Marland Kitchen aspirin EC 81 MG  tablet Take 81 mg by mouth daily.    Marland Kitchen atorvastatin (LIPITOR) 80 MG tablet Take 80 mg by mouth daily.    . Cholecalciferol (VITAMIN D3) 5000 units CAPS Take by mouth.    . furosemide (LASIX) 20 MG tablet Take 20 mg by mouth daily.    Marland Kitchen glipiZIDE (GLUCOTROL XL) 5 MG 24 hr tablet Take 5 mg by mouth daily with breakfast. Takes along with the 10 mg to make a total of 15 mg a day    . hydrochlorothiazide (MICROZIDE) 12.5 MG capsule Take 25 mg by mouth daily.     Marland Kitchen losartan (COZAAR) 25 MG tablet Take 25 mg by mouth daily.    . metFORMIN (GLUCOPHAGE) 1000 MG tablet Take 1,000 mg by mouth 2 (two) times daily.    . nebivolol (BYSTOLIC) 10 MG tablet Take 10 mg by mouth daily.    Marland Kitchen omeprazole (PRILOSEC OTC) 20 MG tablet Take 20 mg by mouth daily as needed (for acid reflex).    Marland Kitchen amLODipine (NORVASC) 10 MG tablet Take 10 mg by mouth daily.    Marland Kitchen glipiZIDE (GLUCOTROL XL) 10 MG 24 hr tablet Take 10 mg by mouth daily with breakfast. Takes along with the 5 mg to make a total of 15 mg a day    . HYDROcodone-acetaminophen (NORCO/VICODIN) 5-325 MG tablet Take 1 tablet by mouth every 6 (six) hours as needed for moderate pain. (Patient not taking: Reported on 10/05/2017) 20 tablet 0  . HYDROmorphone (DILAUDID) 2 MG tablet Take 2-4 mg by mouth every 6 (six) hours as needed for severe pain (depends on pain if takes 1-2 tablets).   0  . modafinil (PROVIGIL) 200 MG tablet Take 100-200 mg by mouth daily as needed (for fatigue). 0.5-1 tablet depending on fatigue    . ondansetron (ZOFRAN) 4 MG tablet Take 1 tablet (4 mg total) by mouth every 6 (six) hours. (Patient not taking: Reported on 09/30/2016) 12 tablet 0  . Vitamin D, Ergocalciferol, (DRISDOL) 50000 units CAPS capsule Take by mouth every 14 (fourteen) days.      No current facility-administered medications for this visit.     ROS:   General:  No weight loss, Fever, chills  HEENT: No recent headaches, no nasal bleeding, no visual changes, no sore  throat  Neurologic: No dizziness, blackouts, seizures. No recent symptoms of stroke or mini- stroke. No recent episodes of slurred speech, or temporary blindness.  Cardiac: No recent episodes of chest pain/pressure, no shortness of breath at rest.  No shortness of breath with  exertion.  Denies history of atrial fibrillation or irregular heartbeat  Vascular: No history of rest pain in feet.  No history of claudication.  No history of non-healing ulcer, No history of DVT   Pulmonary: No home oxygen, no productive cough, no hemoptysis,  No asthma or wheezing  Musculoskeletal:  [ ]  Arthritis, [ ]  Low back pain,  [ ]  Joint pain  Hematologic:No history of hypercoagulable state.  No history of easy bleeding.  No history of anemia  Gastrointestinal: No hematochezia or melena,  No gastroesophageal reflux, no trouble swallowing  Urinary: [X]  chronic Kidney disease, [0] on HD - [ ]  MWF or [ ]  TTHS, [ ]  Burning with urination, [ ]  Frequent urination, [ ]  Difficulty urinating;   Skin: No rashes  Psychological: No history of anxiety,  No history of depression   Physical Examination  Vitals:   10/05/17 1001 10/05/17 1011  BP: (!) 157/74 (!) 149/69  Pulse: 72   Resp: 18   Temp: 98.3 F (36.8 C)   TempSrc: Oral   SpO2: 100%   Weight: 201 lb 9.6 oz (91.4 kg)   Height: 5' 8.75" (1.746 m)     Body mass index is 29.99 kg/m.  General:  Alert and oriented, no acute distress HEENT: Normal Neck: No bruit or JVD Pulmonary: Clear to auscultation bilaterally Cardiac: Regular Rate and Rhythm without murmur Abdomen: Soft, non-tender, non-distended, no mass Skin: No rash Extremity Pulses:  2+ radial, brachial, femoral, dorsalis pedis, posterior tibial pulses bilaterally Musculoskeletal: No deformity left leg edema 10% larger than the right leg  Neurologic: Upper and lower extremity motor 5/5 and symmetric  DATA:  Patient had a venous reflux exam which showed reflux in the common femoral and  greater saphenous vein however the reflux in the greater saphenous vein was limited to the proximal calf and proximal thigh.  The saphenofemoral junction was intact.  Vein diameter on the left side was about 4 mm.  Reviewed and interpreted the study.  ASSESSMENT: Mild to moderate superficial venous reflux left leg with some element of deep vein reflux.  Patient has long-term leg swelling in the left leg after a long flight.  He could have some element of postphlebitic syndrome from a previously unrecognized DVT.  He currently has no evidence of DVT.  I discussed with him the possibility of further evaluation of his iliac veins if his symptoms were debilitating but he states that really he is not significantly bothered by this.  She has easily palpable pulses and no evidence of arterial occlusive disease.  PLAN: Patient was given a prescription today for bilateral lower extremity compression stockings for symptomatic relief of his left leg swelling.  He will follow-up with [place] on an as-needed basis.  Ruta Hinds, MD [facility] Office: [phone] Pager: [phone]

## 2017-10-06 DIAGNOSIS — M24575 Contracture, left foot: Secondary | ICD-10-CM | POA: Diagnosis not present

## 2017-10-06 DIAGNOSIS — M2042 Other hammer toe(s) (acquired), left foot: Secondary | ICD-10-CM | POA: Diagnosis not present

## 2017-10-09 ENCOUNTER — Encounter: Payer: Self-pay | Admitting: Family Medicine

## 2017-10-15 DIAGNOSIS — Z23 Encounter for immunization: Secondary | ICD-10-CM | POA: Diagnosis not present

## 2017-10-26 DIAGNOSIS — H25012 Cortical age-related cataract, left eye: Secondary | ICD-10-CM | POA: Diagnosis not present

## 2017-10-26 DIAGNOSIS — H33311 Horseshoe tear of retina without detachment, right eye: Secondary | ICD-10-CM | POA: Diagnosis not present

## 2017-10-26 DIAGNOSIS — H2513 Age-related nuclear cataract, bilateral: Secondary | ICD-10-CM | POA: Diagnosis not present

## 2017-10-26 DIAGNOSIS — H35363 Drusen (degenerative) of macula, bilateral: Secondary | ICD-10-CM | POA: Diagnosis not present

## 2017-10-26 DIAGNOSIS — H2512 Age-related nuclear cataract, left eye: Secondary | ICD-10-CM | POA: Diagnosis not present

## 2017-10-26 DIAGNOSIS — H353122 Nonexudative age-related macular degeneration, left eye, intermediate dry stage: Secondary | ICD-10-CM | POA: Diagnosis not present

## 2017-10-26 DIAGNOSIS — H25013 Cortical age-related cataract, bilateral: Secondary | ICD-10-CM | POA: Diagnosis not present

## 2017-10-26 DIAGNOSIS — E113292 Type 2 diabetes mellitus with mild nonproliferative diabetic retinopathy without macular edema, left eye: Secondary | ICD-10-CM | POA: Diagnosis not present

## 2017-10-27 ENCOUNTER — Encounter (INDEPENDENT_AMBULATORY_CARE_PROVIDER_SITE_OTHER): Payer: Medicare Other | Admitting: Ophthalmology

## 2017-10-27 DIAGNOSIS — H33021 Retinal detachment with multiple breaks, right eye: Secondary | ICD-10-CM

## 2017-10-27 DIAGNOSIS — H2513 Age-related nuclear cataract, bilateral: Secondary | ICD-10-CM | POA: Diagnosis not present

## 2017-10-27 DIAGNOSIS — H35412 Lattice degeneration of retina, left eye: Secondary | ICD-10-CM

## 2017-10-27 DIAGNOSIS — I10 Essential (primary) hypertension: Secondary | ICD-10-CM | POA: Diagnosis not present

## 2017-10-27 DIAGNOSIS — H35033 Hypertensive retinopathy, bilateral: Secondary | ICD-10-CM | POA: Diagnosis not present

## 2017-10-27 DIAGNOSIS — H43813 Vitreous degeneration, bilateral: Secondary | ICD-10-CM | POA: Diagnosis not present

## 2017-11-02 DIAGNOSIS — L602 Onychogryphosis: Secondary | ICD-10-CM | POA: Diagnosis not present

## 2017-11-02 DIAGNOSIS — E1351 Other specified diabetes mellitus with diabetic peripheral angiopathy without gangrene: Secondary | ICD-10-CM | POA: Diagnosis not present

## 2017-11-02 DIAGNOSIS — L84 Corns and callosities: Secondary | ICD-10-CM | POA: Diagnosis not present

## 2017-11-03 ENCOUNTER — Encounter (INDEPENDENT_AMBULATORY_CARE_PROVIDER_SITE_OTHER): Payer: Medicare Other | Admitting: Ophthalmology

## 2017-11-03 DIAGNOSIS — H33301 Unspecified retinal break, right eye: Secondary | ICD-10-CM

## 2017-11-14 DIAGNOSIS — H2512 Age-related nuclear cataract, left eye: Secondary | ICD-10-CM | POA: Diagnosis not present

## 2017-11-14 DIAGNOSIS — H25812 Combined forms of age-related cataract, left eye: Secondary | ICD-10-CM | POA: Diagnosis not present

## 2017-11-24 ENCOUNTER — Encounter (INDEPENDENT_AMBULATORY_CARE_PROVIDER_SITE_OTHER): Payer: Medicare Other | Admitting: Ophthalmology

## 2017-11-24 DIAGNOSIS — Z7984 Long term (current) use of oral hypoglycemic drugs: Secondary | ICD-10-CM | POA: Diagnosis not present

## 2017-11-24 DIAGNOSIS — H33301 Unspecified retinal break, right eye: Secondary | ICD-10-CM

## 2017-11-24 DIAGNOSIS — I10 Essential (primary) hypertension: Secondary | ICD-10-CM | POA: Diagnosis not present

## 2017-11-24 DIAGNOSIS — E1121 Type 2 diabetes mellitus with diabetic nephropathy: Secondary | ICD-10-CM | POA: Diagnosis not present

## 2017-11-24 DIAGNOSIS — N183 Chronic kidney disease, stage 3 (moderate): Secondary | ICD-10-CM | POA: Diagnosis not present

## 2017-12-11 DIAGNOSIS — N183 Chronic kidney disease, stage 3 (moderate): Secondary | ICD-10-CM | POA: Diagnosis not present

## 2017-12-18 DIAGNOSIS — H25011 Cortical age-related cataract, right eye: Secondary | ICD-10-CM | POA: Diagnosis not present

## 2017-12-18 DIAGNOSIS — H2511 Age-related nuclear cataract, right eye: Secondary | ICD-10-CM | POA: Diagnosis not present

## 2018-01-05 DIAGNOSIS — E1351 Other specified diabetes mellitus with diabetic peripheral angiopathy without gangrene: Secondary | ICD-10-CM | POA: Diagnosis not present

## 2018-01-05 DIAGNOSIS — M76821 Posterior tibial tendinitis, right leg: Secondary | ICD-10-CM | POA: Diagnosis not present

## 2018-01-05 DIAGNOSIS — M76822 Posterior tibial tendinitis, left leg: Secondary | ICD-10-CM | POA: Diagnosis not present

## 2018-01-05 DIAGNOSIS — L84 Corns and callosities: Secondary | ICD-10-CM | POA: Diagnosis not present

## 2018-01-05 DIAGNOSIS — L602 Onychogryphosis: Secondary | ICD-10-CM | POA: Diagnosis not present

## 2018-01-25 DIAGNOSIS — H2511 Age-related nuclear cataract, right eye: Secondary | ICD-10-CM | POA: Diagnosis not present

## 2018-01-25 DIAGNOSIS — H25011 Cortical age-related cataract, right eye: Secondary | ICD-10-CM | POA: Diagnosis not present

## 2018-02-02 ENCOUNTER — Encounter (INDEPENDENT_AMBULATORY_CARE_PROVIDER_SITE_OTHER): Payer: Medicare Other | Admitting: Ophthalmology

## 2018-02-02 DIAGNOSIS — H35033 Hypertensive retinopathy, bilateral: Secondary | ICD-10-CM

## 2018-02-02 DIAGNOSIS — H33301 Unspecified retinal break, right eye: Secondary | ICD-10-CM | POA: Diagnosis not present

## 2018-02-02 DIAGNOSIS — I10 Essential (primary) hypertension: Secondary | ICD-10-CM | POA: Diagnosis not present

## 2018-02-02 DIAGNOSIS — H43813 Vitreous degeneration, bilateral: Secondary | ICD-10-CM

## 2018-02-06 DIAGNOSIS — H2511 Age-related nuclear cataract, right eye: Secondary | ICD-10-CM | POA: Diagnosis not present

## 2018-02-06 DIAGNOSIS — H25811 Combined forms of age-related cataract, right eye: Secondary | ICD-10-CM | POA: Diagnosis not present

## 2018-03-02 ENCOUNTER — Encounter (INDEPENDENT_AMBULATORY_CARE_PROVIDER_SITE_OTHER): Payer: Medicare Other | Admitting: Ophthalmology

## 2018-03-02 DIAGNOSIS — H35033 Hypertensive retinopathy, bilateral: Secondary | ICD-10-CM | POA: Diagnosis not present

## 2018-03-02 DIAGNOSIS — H33301 Unspecified retinal break, right eye: Secondary | ICD-10-CM

## 2018-03-02 DIAGNOSIS — H43813 Vitreous degeneration, bilateral: Secondary | ICD-10-CM | POA: Diagnosis not present

## 2018-03-02 DIAGNOSIS — H35371 Puckering of macula, right eye: Secondary | ICD-10-CM

## 2018-03-02 DIAGNOSIS — I10 Essential (primary) hypertension: Secondary | ICD-10-CM

## 2018-03-09 DIAGNOSIS — N183 Chronic kidney disease, stage 3 (moderate): Secondary | ICD-10-CM | POA: Diagnosis not present

## 2018-03-16 DIAGNOSIS — E1122 Type 2 diabetes mellitus with diabetic chronic kidney disease: Secondary | ICD-10-CM | POA: Diagnosis not present

## 2018-03-16 DIAGNOSIS — N183 Chronic kidney disease, stage 3 (moderate): Secondary | ICD-10-CM | POA: Diagnosis not present

## 2018-03-16 DIAGNOSIS — I129 Hypertensive chronic kidney disease with stage 1 through stage 4 chronic kidney disease, or unspecified chronic kidney disease: Secondary | ICD-10-CM | POA: Diagnosis not present

## 2018-03-30 DIAGNOSIS — E1351 Other specified diabetes mellitus with diabetic peripheral angiopathy without gangrene: Secondary | ICD-10-CM | POA: Diagnosis not present

## 2018-03-30 DIAGNOSIS — L602 Onychogryphosis: Secondary | ICD-10-CM | POA: Diagnosis not present

## 2018-03-30 DIAGNOSIS — L84 Corns and callosities: Secondary | ICD-10-CM | POA: Diagnosis not present

## 2018-04-13 DIAGNOSIS — L97529 Non-pressure chronic ulcer of other part of left foot with unspecified severity: Secondary | ICD-10-CM | POA: Diagnosis not present

## 2018-04-13 DIAGNOSIS — E1140 Type 2 diabetes mellitus with diabetic neuropathy, unspecified: Secondary | ICD-10-CM | POA: Diagnosis not present

## 2018-04-13 DIAGNOSIS — L03032 Cellulitis of left toe: Secondary | ICD-10-CM | POA: Diagnosis not present

## 2018-04-13 DIAGNOSIS — M205X2 Other deformities of toe(s) (acquired), left foot: Secondary | ICD-10-CM | POA: Diagnosis not present

## 2018-04-20 DIAGNOSIS — M2042 Other hammer toe(s) (acquired), left foot: Secondary | ICD-10-CM | POA: Diagnosis not present

## 2018-04-20 DIAGNOSIS — E1140 Type 2 diabetes mellitus with diabetic neuropathy, unspecified: Secondary | ICD-10-CM | POA: Diagnosis not present

## 2018-04-20 DIAGNOSIS — L97529 Non-pressure chronic ulcer of other part of left foot with unspecified severity: Secondary | ICD-10-CM | POA: Diagnosis not present

## 2018-04-20 DIAGNOSIS — M205X2 Other deformities of toe(s) (acquired), left foot: Secondary | ICD-10-CM | POA: Diagnosis not present

## 2018-05-04 DIAGNOSIS — E1121 Type 2 diabetes mellitus with diabetic nephropathy: Secondary | ICD-10-CM | POA: Diagnosis not present

## 2018-05-04 DIAGNOSIS — N183 Chronic kidney disease, stage 3 (moderate): Secondary | ICD-10-CM | POA: Diagnosis not present

## 2018-05-04 DIAGNOSIS — L97529 Non-pressure chronic ulcer of other part of left foot with unspecified severity: Secondary | ICD-10-CM | POA: Diagnosis not present

## 2018-05-04 DIAGNOSIS — Z7984 Long term (current) use of oral hypoglycemic drugs: Secondary | ICD-10-CM | POA: Diagnosis not present

## 2018-05-04 DIAGNOSIS — E7800 Pure hypercholesterolemia, unspecified: Secondary | ICD-10-CM | POA: Diagnosis not present

## 2018-05-04 DIAGNOSIS — I10 Essential (primary) hypertension: Secondary | ICD-10-CM | POA: Diagnosis not present

## 2018-05-11 DIAGNOSIS — M2042 Other hammer toe(s) (acquired), left foot: Secondary | ICD-10-CM | POA: Diagnosis not present

## 2018-06-01 DIAGNOSIS — E1351 Other specified diabetes mellitus with diabetic peripheral angiopathy without gangrene: Secondary | ICD-10-CM | POA: Diagnosis not present

## 2018-06-01 DIAGNOSIS — L84 Corns and callosities: Secondary | ICD-10-CM | POA: Diagnosis not present

## 2018-08-17 DIAGNOSIS — E1351 Other specified diabetes mellitus with diabetic peripheral angiopathy without gangrene: Secondary | ICD-10-CM | POA: Diagnosis not present

## 2018-08-17 DIAGNOSIS — L602 Onychogryphosis: Secondary | ICD-10-CM | POA: Diagnosis not present

## 2018-08-17 DIAGNOSIS — L84 Corns and callosities: Secondary | ICD-10-CM | POA: Diagnosis not present

## 2018-08-24 DIAGNOSIS — Z7984 Long term (current) use of oral hypoglycemic drugs: Secondary | ICD-10-CM | POA: Diagnosis not present

## 2018-08-24 DIAGNOSIS — Z1211 Encounter for screening for malignant neoplasm of colon: Secondary | ICD-10-CM | POA: Diagnosis not present

## 2018-08-24 DIAGNOSIS — N182 Chronic kidney disease, stage 2 (mild): Secondary | ICD-10-CM | POA: Diagnosis not present

## 2018-08-24 DIAGNOSIS — E1121 Type 2 diabetes mellitus with diabetic nephropathy: Secondary | ICD-10-CM | POA: Diagnosis not present

## 2018-08-24 DIAGNOSIS — Z125 Encounter for screening for malignant neoplasm of prostate: Secondary | ICD-10-CM | POA: Diagnosis not present

## 2018-08-24 DIAGNOSIS — E7800 Pure hypercholesterolemia, unspecified: Secondary | ICD-10-CM | POA: Diagnosis not present

## 2018-08-24 DIAGNOSIS — N183 Chronic kidney disease, stage 3 (moderate): Secondary | ICD-10-CM | POA: Diagnosis not present

## 2018-09-03 DIAGNOSIS — Z79899 Other long term (current) drug therapy: Secondary | ICD-10-CM | POA: Diagnosis not present

## 2018-09-03 DIAGNOSIS — I10 Essential (primary) hypertension: Secondary | ICD-10-CM | POA: Diagnosis not present

## 2018-09-03 DIAGNOSIS — E7800 Pure hypercholesterolemia, unspecified: Secondary | ICD-10-CM | POA: Diagnosis not present

## 2018-09-03 DIAGNOSIS — Z0001 Encounter for general adult medical examination with abnormal findings: Secondary | ICD-10-CM | POA: Diagnosis not present

## 2018-09-03 DIAGNOSIS — E1121 Type 2 diabetes mellitus with diabetic nephropathy: Secondary | ICD-10-CM | POA: Diagnosis not present

## 2018-09-03 DIAGNOSIS — I872 Venous insufficiency (chronic) (peripheral): Secondary | ICD-10-CM | POA: Diagnosis not present

## 2018-09-03 DIAGNOSIS — Z7984 Long term (current) use of oral hypoglycemic drugs: Secondary | ICD-10-CM | POA: Diagnosis not present

## 2018-09-03 DIAGNOSIS — Z23 Encounter for immunization: Secondary | ICD-10-CM | POA: Diagnosis not present

## 2018-09-03 DIAGNOSIS — Z1211 Encounter for screening for malignant neoplasm of colon: Secondary | ICD-10-CM | POA: Diagnosis not present

## 2018-09-03 DIAGNOSIS — N183 Chronic kidney disease, stage 3 (moderate): Secondary | ICD-10-CM | POA: Diagnosis not present

## 2018-09-21 ENCOUNTER — Encounter (INDEPENDENT_AMBULATORY_CARE_PROVIDER_SITE_OTHER): Payer: Medicare Other | Admitting: Ophthalmology

## 2018-09-21 ENCOUNTER — Other Ambulatory Visit: Payer: Self-pay

## 2018-09-21 DIAGNOSIS — I10 Essential (primary) hypertension: Secondary | ICD-10-CM

## 2018-09-21 DIAGNOSIS — H33301 Unspecified retinal break, right eye: Secondary | ICD-10-CM | POA: Diagnosis not present

## 2018-09-21 DIAGNOSIS — H35033 Hypertensive retinopathy, bilateral: Secondary | ICD-10-CM | POA: Diagnosis not present

## 2018-09-21 DIAGNOSIS — H43813 Vitreous degeneration, bilateral: Secondary | ICD-10-CM

## 2018-09-21 DIAGNOSIS — H35412 Lattice degeneration of retina, left eye: Secondary | ICD-10-CM

## 2018-09-29 DIAGNOSIS — Z23 Encounter for immunization: Secondary | ICD-10-CM | POA: Diagnosis not present

## 2018-10-05 DIAGNOSIS — N183 Chronic kidney disease, stage 3 unspecified: Secondary | ICD-10-CM | POA: Diagnosis not present

## 2018-10-12 DIAGNOSIS — N183 Chronic kidney disease, stage 3 unspecified: Secondary | ICD-10-CM | POA: Diagnosis not present

## 2018-10-12 DIAGNOSIS — N1832 Chronic kidney disease, stage 3b: Secondary | ICD-10-CM | POA: Diagnosis not present

## 2018-10-12 DIAGNOSIS — E1121 Type 2 diabetes mellitus with diabetic nephropathy: Secondary | ICD-10-CM | POA: Diagnosis not present

## 2018-10-12 DIAGNOSIS — I129 Hypertensive chronic kidney disease with stage 1 through stage 4 chronic kidney disease, or unspecified chronic kidney disease: Secondary | ICD-10-CM | POA: Diagnosis not present

## 2018-10-12 DIAGNOSIS — N200 Calculus of kidney: Secondary | ICD-10-CM | POA: Diagnosis not present

## 2018-11-16 DIAGNOSIS — E1351 Other specified diabetes mellitus with diabetic peripheral angiopathy without gangrene: Secondary | ICD-10-CM | POA: Diagnosis not present

## 2018-11-16 DIAGNOSIS — L602 Onychogryphosis: Secondary | ICD-10-CM | POA: Diagnosis not present

## 2019-01-25 DIAGNOSIS — G609 Hereditary and idiopathic neuropathy, unspecified: Secondary | ICD-10-CM | POA: Diagnosis not present

## 2019-01-25 DIAGNOSIS — E1351 Other specified diabetes mellitus with diabetic peripheral angiopathy without gangrene: Secondary | ICD-10-CM | POA: Diagnosis not present

## 2019-01-25 DIAGNOSIS — L602 Onychogryphosis: Secondary | ICD-10-CM | POA: Diagnosis not present

## 2019-01-25 DIAGNOSIS — M898X9 Other specified disorders of bone, unspecified site: Secondary | ICD-10-CM | POA: Diagnosis not present

## 2019-01-25 DIAGNOSIS — M76822 Posterior tibial tendinitis, left leg: Secondary | ICD-10-CM | POA: Diagnosis not present

## 2019-01-25 DIAGNOSIS — R2689 Other abnormalities of gait and mobility: Secondary | ICD-10-CM | POA: Diagnosis not present

## 2019-01-29 ENCOUNTER — Ambulatory Visit: Payer: 59

## 2019-02-15 ENCOUNTER — Ambulatory Visit: Payer: 59

## 2019-03-11 DIAGNOSIS — Z0000 Encounter for general adult medical examination without abnormal findings: Secondary | ICD-10-CM | POA: Diagnosis not present

## 2019-03-11 DIAGNOSIS — E7800 Pure hypercholesterolemia, unspecified: Secondary | ICD-10-CM | POA: Diagnosis not present

## 2019-03-11 DIAGNOSIS — E1121 Type 2 diabetes mellitus with diabetic nephropathy: Secondary | ICD-10-CM | POA: Diagnosis not present

## 2019-03-11 DIAGNOSIS — N1831 Chronic kidney disease, stage 3a: Secondary | ICD-10-CM | POA: Diagnosis not present

## 2019-03-11 DIAGNOSIS — Z7984 Long term (current) use of oral hypoglycemic drugs: Secondary | ICD-10-CM | POA: Diagnosis not present

## 2019-03-11 DIAGNOSIS — Z125 Encounter for screening for malignant neoplasm of prostate: Secondary | ICD-10-CM | POA: Diagnosis not present

## 2019-03-15 DIAGNOSIS — E1121 Type 2 diabetes mellitus with diabetic nephropathy: Secondary | ICD-10-CM | POA: Diagnosis not present

## 2019-03-15 DIAGNOSIS — E7800 Pure hypercholesterolemia, unspecified: Secondary | ICD-10-CM | POA: Diagnosis not present

## 2019-03-15 DIAGNOSIS — Z7984 Long term (current) use of oral hypoglycemic drugs: Secondary | ICD-10-CM | POA: Diagnosis not present

## 2019-03-15 DIAGNOSIS — N184 Chronic kidney disease, stage 4 (severe): Secondary | ICD-10-CM | POA: Diagnosis not present

## 2019-03-15 DIAGNOSIS — I10 Essential (primary) hypertension: Secondary | ICD-10-CM | POA: Diagnosis not present

## 2019-03-29 ENCOUNTER — Encounter (INDEPENDENT_AMBULATORY_CARE_PROVIDER_SITE_OTHER): Payer: Medicare Other | Admitting: Ophthalmology

## 2019-04-05 DIAGNOSIS — E1351 Other specified diabetes mellitus with diabetic peripheral angiopathy without gangrene: Secondary | ICD-10-CM | POA: Diagnosis not present

## 2019-04-05 DIAGNOSIS — L602 Onychogryphosis: Secondary | ICD-10-CM | POA: Diagnosis not present

## 2019-04-05 DIAGNOSIS — L84 Corns and callosities: Secondary | ICD-10-CM | POA: Diagnosis not present

## 2019-04-19 ENCOUNTER — Encounter (INDEPENDENT_AMBULATORY_CARE_PROVIDER_SITE_OTHER): Payer: Medicare Other | Admitting: Ophthalmology

## 2019-04-19 DIAGNOSIS — H33301 Unspecified retinal break, right eye: Secondary | ICD-10-CM

## 2019-04-19 DIAGNOSIS — H35033 Hypertensive retinopathy, bilateral: Secondary | ICD-10-CM

## 2019-04-19 DIAGNOSIS — I10 Essential (primary) hypertension: Secondary | ICD-10-CM

## 2019-04-19 DIAGNOSIS — H43813 Vitreous degeneration, bilateral: Secondary | ICD-10-CM

## 2019-04-19 DIAGNOSIS — H35371 Puckering of macula, right eye: Secondary | ICD-10-CM

## 2019-04-21 IMAGING — CT CT ABD-PELV W/O CM
2 of 4 series · 16 of 46 positions shown, 18 images · non-contrast
Comparison: None.

CLINICAL DATA: Abdominal pain for several days, initial encounter

EXAM:
CT ABDOMEN AND PELVIS WITHOUT CONTRAST
TECHNIQUE: Multidetector CT imaging of the abdomen and pelvis was performed
following the standard protocol without IV contrast.

[Series 2: abd/pel w/o · axial · non-contrast · 0.79mm/px · z∈[-422,+28]mm · 13 of 103 slices shown, 15 images]
[im 7/103  soft-tissue]
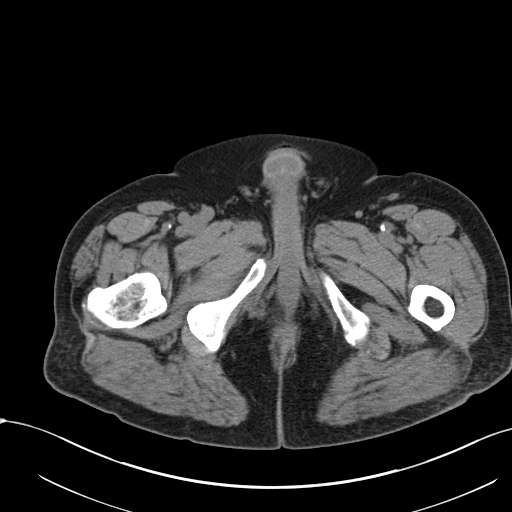
[im 7/103  bone]
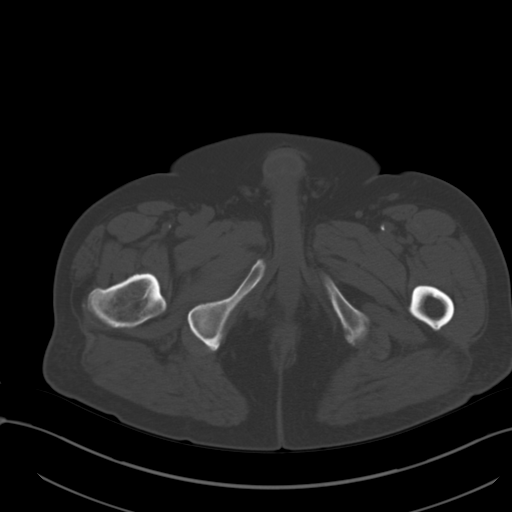
[im 13/103  soft-tissue]
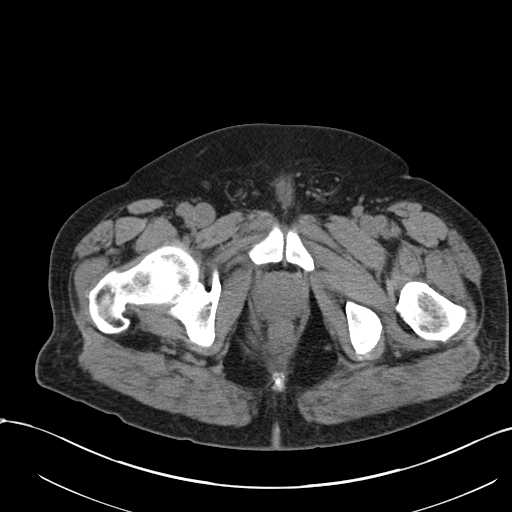
[im 25/103  soft-tissue]
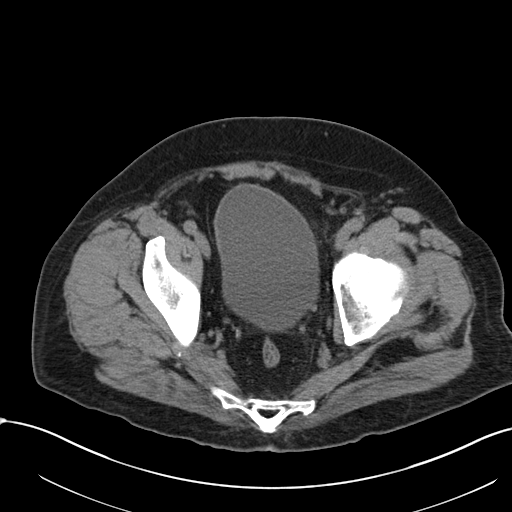
[im 31/103  soft-tissue]
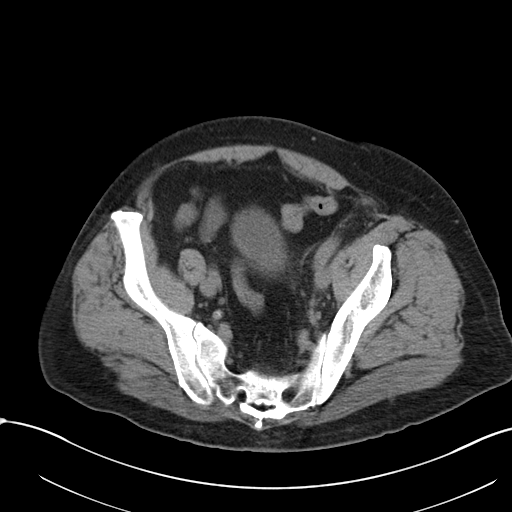
[im 37/103  soft-tissue]
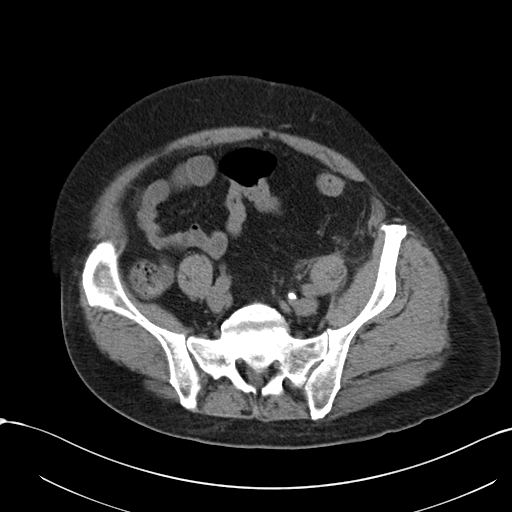
[im 43/103  soft-tissue]
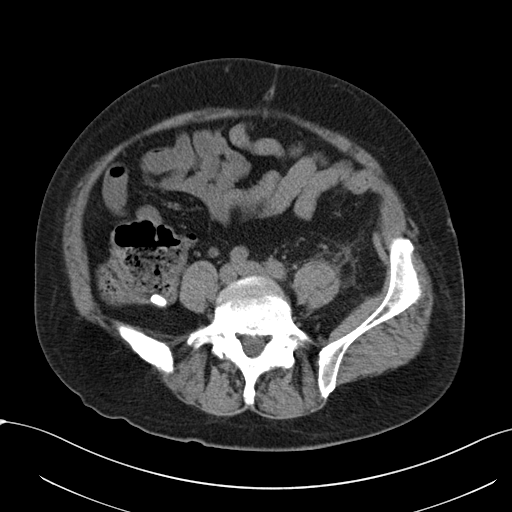
[im 55/103  soft-tissue]
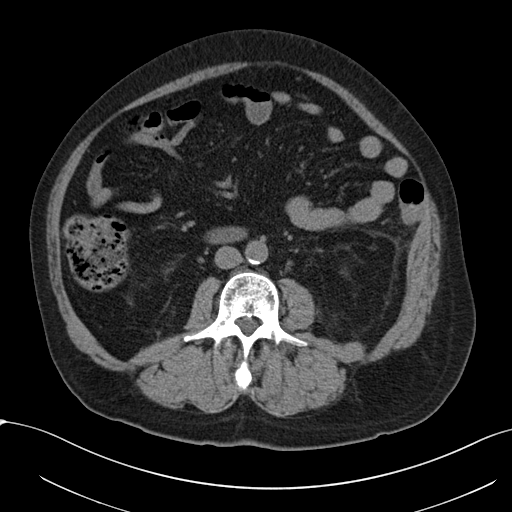
[im 61/103  soft-tissue]
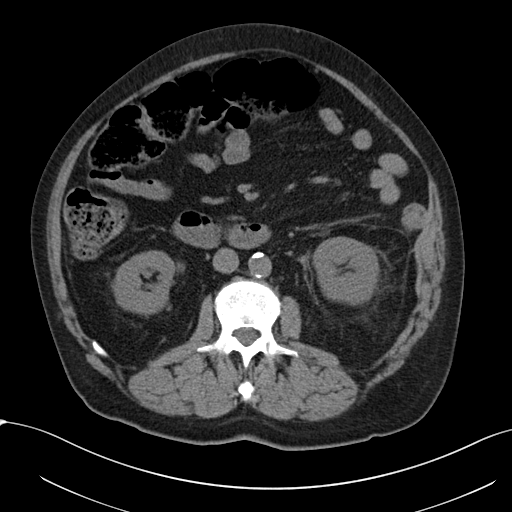
[im 67/103  soft-tissue]
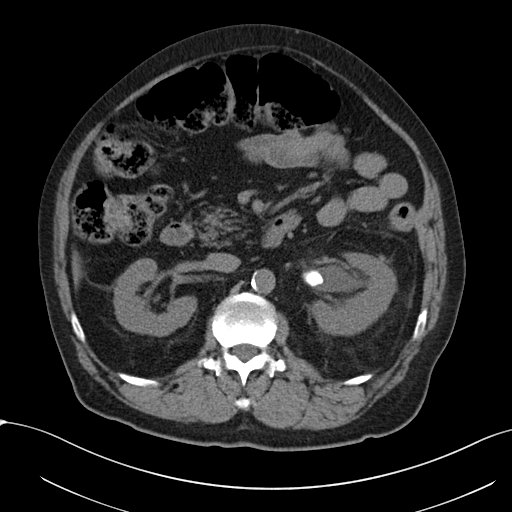
[im 67/103  bone]
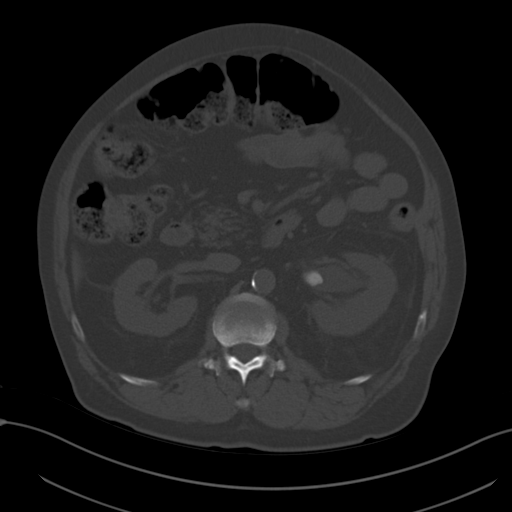
[im 73/103  soft-tissue]
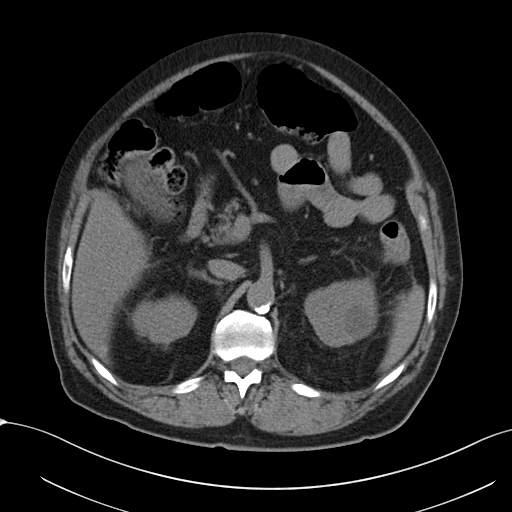
[im 79/103  soft-tissue]
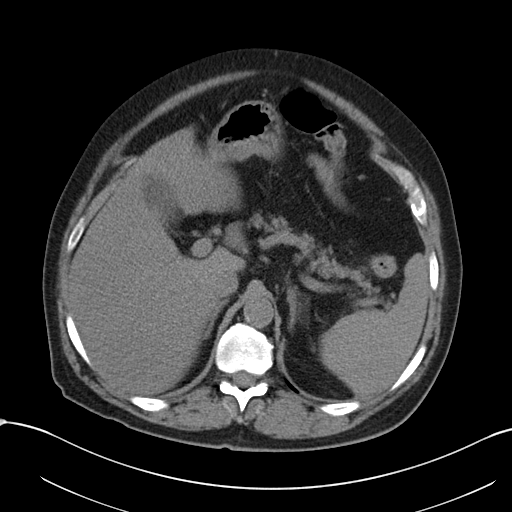
[im 91/103  soft-tissue]
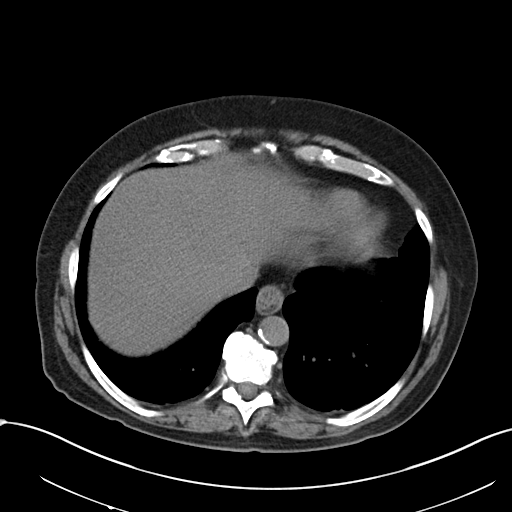
[im 97/103  soft-tissue]
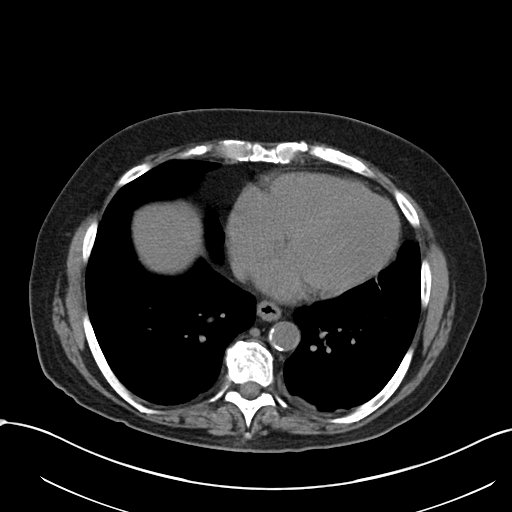

[Series 4: coronal · coronal · 0.78mm/px · 3 of 189 slices shown]
[im 63/189  soft-tissue]
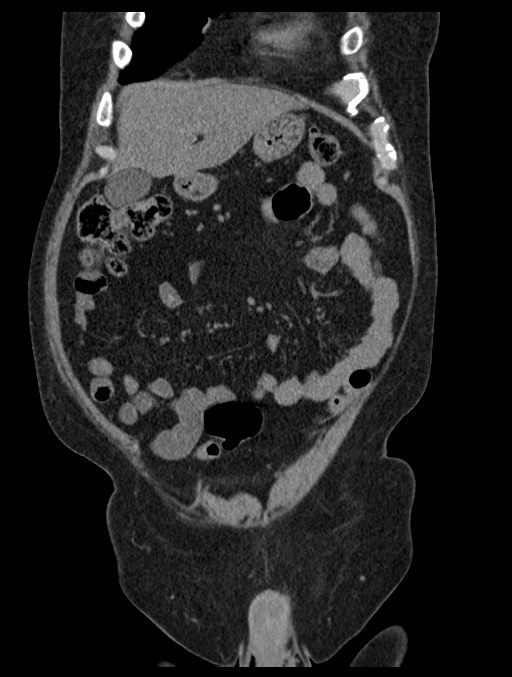
[im 84/189  soft-tissue]
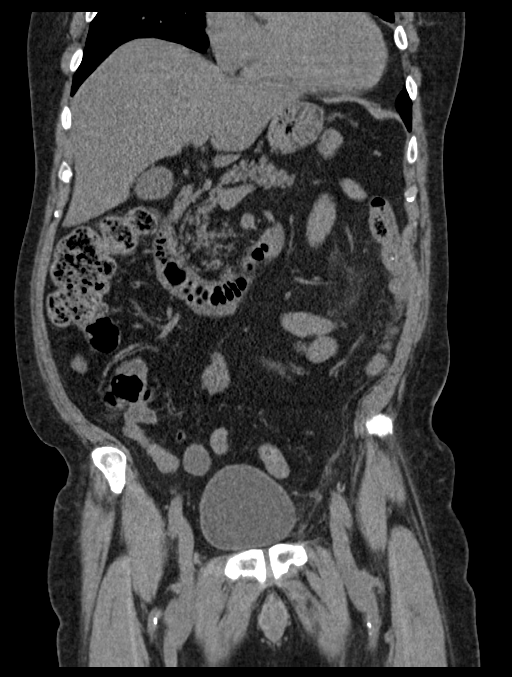
[im 105/189  soft-tissue]
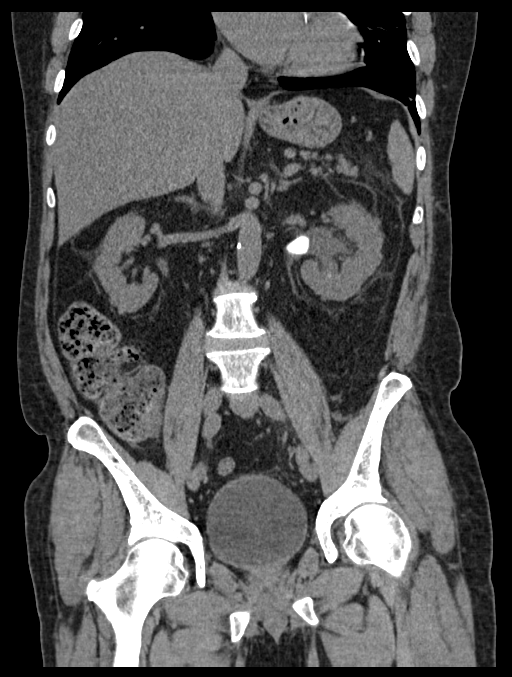

[16 of 46 positions shown; findings below may reference images not displayed]

FINDINGS: Lower chest: Lung bases demonstrate some mild atelectatic changes
without focal confluent infiltrate.

Hepatobiliary: No focal liver abnormality is seen. No gallstones,
gallbladder wall thickening, or biliary dilatation.

Pancreas: Unremarkable. No pancreatic ductal dilatation or
surrounding inflammatory changes.

Spleen: Normal in size without focal abnormality.

Adrenals/Urinary Tract: The adrenal glands are within normal limits.
The kidneys are well visualized bilaterally. Large 1.9 cm left renal
pelvic stone is noted with mild obstructive change. Scattered cysts
are seen within the left kidney. The largest of these measures
approximately 3.3 cm in greatest dimension. The ureters are within
normal limits bilaterally. The bladder is well distended.

Stomach/Bowel: The appendix is not well visualized although no
inflammatory changes are seen. No obstructive changes are noted.
Ingested tablets are seen in the cecum.

Vascular/Lymphatic: Aortic atherosclerosis. No enlarged abdominal or
pelvic lymph nodes.

Reproductive: Prostate is unremarkable.

Other: No abdominal wall hernia or abnormality. No abdominopelvic
ascites.

Musculoskeletal: Degenerative changes of the lumbar spine are noted.
IMPRESSION: 1.9 cm left renal pelvic stone with mild obstructive change.

Left renal cysts.

No other focal abnormality is noted.

## 2019-05-21 DIAGNOSIS — D631 Anemia in chronic kidney disease: Secondary | ICD-10-CM | POA: Diagnosis not present

## 2019-05-21 DIAGNOSIS — N1832 Chronic kidney disease, stage 3b: Secondary | ICD-10-CM | POA: Diagnosis not present

## 2019-05-24 DIAGNOSIS — N1832 Chronic kidney disease, stage 3b: Secondary | ICD-10-CM | POA: Diagnosis not present

## 2019-05-24 DIAGNOSIS — D631 Anemia in chronic kidney disease: Secondary | ICD-10-CM | POA: Diagnosis not present

## 2019-05-24 DIAGNOSIS — E1122 Type 2 diabetes mellitus with diabetic chronic kidney disease: Secondary | ICD-10-CM | POA: Diagnosis not present

## 2019-05-24 DIAGNOSIS — I129 Hypertensive chronic kidney disease with stage 1 through stage 4 chronic kidney disease, or unspecified chronic kidney disease: Secondary | ICD-10-CM | POA: Diagnosis not present

## 2019-05-24 DIAGNOSIS — N183 Chronic kidney disease, stage 3 unspecified: Secondary | ICD-10-CM | POA: Diagnosis not present

## 2019-06-14 DIAGNOSIS — L602 Onychogryphosis: Secondary | ICD-10-CM | POA: Diagnosis not present

## 2019-06-14 DIAGNOSIS — E1351 Other specified diabetes mellitus with diabetic peripheral angiopathy without gangrene: Secondary | ICD-10-CM | POA: Diagnosis not present

## 2019-07-30 DIAGNOSIS — R110 Nausea: Secondary | ICD-10-CM | POA: Diagnosis not present

## 2019-07-30 DIAGNOSIS — R1084 Generalized abdominal pain: Secondary | ICD-10-CM | POA: Diagnosis not present

## 2019-07-30 DIAGNOSIS — N201 Calculus of ureter: Secondary | ICD-10-CM | POA: Diagnosis not present

## 2019-07-30 DIAGNOSIS — N132 Hydronephrosis with renal and ureteral calculous obstruction: Secondary | ICD-10-CM | POA: Diagnosis not present

## 2019-07-30 DIAGNOSIS — I700 Atherosclerosis of aorta: Secondary | ICD-10-CM | POA: Diagnosis not present

## 2019-08-09 DIAGNOSIS — N201 Calculus of ureter: Secondary | ICD-10-CM | POA: Diagnosis not present

## 2019-08-09 DIAGNOSIS — N132 Hydronephrosis with renal and ureteral calculous obstruction: Secondary | ICD-10-CM | POA: Diagnosis not present

## 2019-08-13 ENCOUNTER — Other Ambulatory Visit: Payer: Self-pay

## 2019-08-13 ENCOUNTER — Other Ambulatory Visit: Payer: Self-pay | Admitting: Urology

## 2019-08-13 ENCOUNTER — Encounter (HOSPITAL_BASED_OUTPATIENT_CLINIC_OR_DEPARTMENT_OTHER): Payer: Self-pay | Admitting: Urology

## 2019-08-13 NOTE — Progress Notes (Signed)
Spoke w/ via phone for pre-op interview---PT Lab needs dos----    I stat 8, ekg            Lab results------lov nephrology dr Neta Ehlers nephrology (ckd stage 3 a) 05-24-2019 care everywhere COVID test ------08-16-2019 at 1410 Arrive at -------530 am 08-19-2019 NPO after MN NO Solid Food.  Clear liquids from MN until---430 am then npo Medications to take morning of surgery -----atorvastatin, bystolic, allopurinol Diabetic medication -----none day of surgery Patient Special Instructions -----none Pre-Op special Istructions -----none Patient verbalized understanding of instructions that were given at this phone interview. Patient denies shortness of breath, chest pain, fever, cough at this phone interview.

## 2019-08-16 ENCOUNTER — Other Ambulatory Visit (HOSPITAL_COMMUNITY)
Admission: RE | Admit: 2019-08-16 | Discharge: 2019-08-16 | Disposition: A | Discharge status: Home / Self Care | Payer: Medicare Other | Source: Ambulatory Visit | Attending: Urology | Admitting: Urology

## 2019-08-16 DIAGNOSIS — Z20822 Contact with and (suspected) exposure to covid-19: Secondary | ICD-10-CM | POA: Diagnosis not present

## 2019-08-16 DIAGNOSIS — Z01812 Encounter for preprocedural laboratory examination: Secondary | ICD-10-CM | POA: Diagnosis not present

## 2019-08-16 DIAGNOSIS — E1351 Other specified diabetes mellitus with diabetic peripheral angiopathy without gangrene: Secondary | ICD-10-CM | POA: Diagnosis not present

## 2019-08-16 DIAGNOSIS — L602 Onychogryphosis: Secondary | ICD-10-CM | POA: Diagnosis not present

## 2019-08-16 LAB — SARS CORONAVIRUS 2 (TAT 6-24 HRS): SARS Coronavirus 2: NEGATIVE

## 2019-08-19 ENCOUNTER — Encounter (HOSPITAL_BASED_OUTPATIENT_CLINIC_OR_DEPARTMENT_OTHER): Payer: Self-pay | Admitting: Urology

## 2019-08-19 ENCOUNTER — Other Ambulatory Visit: Payer: Self-pay

## 2019-08-19 ENCOUNTER — Ambulatory Visit (HOSPITAL_BASED_OUTPATIENT_CLINIC_OR_DEPARTMENT_OTHER)
Admission: RE | Admit: 2019-08-19 | Discharge: 2019-08-19 | Disposition: A | Discharge status: Home / Self Care | Payer: Medicare Other | Attending: Urology | Admitting: Urology

## 2019-08-19 ENCOUNTER — Ambulatory Visit (HOSPITAL_BASED_OUTPATIENT_CLINIC_OR_DEPARTMENT_OTHER): Payer: Medicare Other | Admitting: Anesthesiology

## 2019-08-19 ENCOUNTER — Encounter (HOSPITAL_BASED_OUTPATIENT_CLINIC_OR_DEPARTMENT_OTHER)
Admission: RE | Disposition: A | Discharge status: Home / Self Care | Payer: Self-pay | Source: Home / Self Care | Attending: Urology

## 2019-08-19 DIAGNOSIS — I129 Hypertensive chronic kidney disease with stage 1 through stage 4 chronic kidney disease, or unspecified chronic kidney disease: Secondary | ICD-10-CM | POA: Diagnosis not present

## 2019-08-19 DIAGNOSIS — Z7984 Long term (current) use of oral hypoglycemic drugs: Secondary | ICD-10-CM | POA: Insufficient documentation

## 2019-08-19 DIAGNOSIS — E1122 Type 2 diabetes mellitus with diabetic chronic kidney disease: Secondary | ICD-10-CM | POA: Diagnosis not present

## 2019-08-19 DIAGNOSIS — Z7982 Long term (current) use of aspirin: Secondary | ICD-10-CM | POA: Diagnosis not present

## 2019-08-19 DIAGNOSIS — F17210 Nicotine dependence, cigarettes, uncomplicated: Secondary | ICD-10-CM | POA: Insufficient documentation

## 2019-08-19 DIAGNOSIS — Z880 Allergy status to penicillin: Secondary | ICD-10-CM | POA: Insufficient documentation

## 2019-08-19 DIAGNOSIS — N202 Calculus of kidney with calculus of ureter: Secondary | ICD-10-CM | POA: Diagnosis not present

## 2019-08-19 DIAGNOSIS — Z79899 Other long term (current) drug therapy: Secondary | ICD-10-CM | POA: Diagnosis not present

## 2019-08-19 DIAGNOSIS — K219 Gastro-esophageal reflux disease without esophagitis: Secondary | ICD-10-CM | POA: Insufficient documentation

## 2019-08-19 DIAGNOSIS — N201 Calculus of ureter: Secondary | ICD-10-CM | POA: Insufficient documentation

## 2019-08-19 DIAGNOSIS — N183 Chronic kidney disease, stage 3 unspecified: Secondary | ICD-10-CM | POA: Diagnosis not present

## 2019-08-19 HISTORY — PX: CYSTOSCOPY/URETEROSCOPY/HOLMIUM LASER/STENT PLACEMENT: SHX6546

## 2019-08-19 HISTORY — DX: Gastro-esophageal reflux disease without esophagitis: K21.9

## 2019-08-19 LAB — POCT I-STAT, CHEM 8
BUN: 38 mg/dL — ABNORMAL HIGH (ref 8–23)
Calcium, Ion: 1.3 mmol/L (ref 1.15–1.40)
Chloride: 103 mmol/L (ref 98–111)
Creatinine, Ser: 3.4 mg/dL — ABNORMAL HIGH (ref 0.61–1.24)
Glucose, Bld: 139 mg/dL — ABNORMAL HIGH (ref 70–99)
HCT: 31 % — ABNORMAL LOW (ref 39.0–52.0)
Hemoglobin: 10.5 g/dL — ABNORMAL LOW (ref 13.0–17.0)
Potassium: 4.1 mmol/L (ref 3.5–5.1)
Sodium: 137 mmol/L (ref 135–145)
TCO2: 19 mmol/L — ABNORMAL LOW (ref 22–32)

## 2019-08-19 LAB — GLUCOSE, CAPILLARY: Glucose-Capillary: 155 mg/dL — ABNORMAL HIGH (ref 70–99)

## 2019-08-19 SURGERY — CYSTOSCOPY/URETEROSCOPY/HOLMIUM LASER/STENT PLACEMENT
Anesthesia: General | Laterality: Left

## 2019-08-19 MED ORDER — FENTANYL CITRATE (PF) 100 MCG/2ML IJ SOLN
INTRAMUSCULAR | Status: AC
  Filled 2019-08-19: qty 2

## 2019-08-19 MED ORDER — SODIUM CHLORIDE 0.9 % IV SOLN: INTRAVENOUS | Status: DC

## 2019-08-19 MED ORDER — MEPERIDINE HCL 25 MG/ML IJ SOLN: 6.2500 mg | INTRAMUSCULAR | Status: DC | PRN

## 2019-08-19 MED ORDER — ONDANSETRON HCL 4 MG/2ML IJ SOLN
INTRAMUSCULAR | Status: AC
  Filled 2019-08-19: qty 2

## 2019-08-19 MED ORDER — DEXAMETHASONE SODIUM PHOSPHATE 10 MG/ML IJ SOLN
INTRAMUSCULAR | Status: DC | PRN
  Administered 2019-08-19: 4 mg via INTRAVENOUS

## 2019-08-19 MED ORDER — PHENYLEPHRINE 40 MCG/ML (10ML) SYRINGE FOR IV PUSH (FOR BLOOD PRESSURE SUPPORT)
PREFILLED_SYRINGE | INTRAVENOUS | Status: DC | PRN
  Administered 2019-08-19: 120 ug via INTRAVENOUS
  Administered 2019-08-19: 80 ug via INTRAVENOUS
  Administered 2019-08-19: 40 ug via INTRAVENOUS
  Administered 2019-08-19 (×2): 80 ug via INTRAVENOUS

## 2019-08-19 MED ORDER — EPHEDRINE 5 MG/ML INJ
INTRAVENOUS | Status: AC
  Filled 2019-08-19: qty 10

## 2019-08-19 MED ORDER — HYDROCODONE-ACETAMINOPHEN 5-325 MG PO TABS: 1.0000 | ORAL_TABLET | ORAL | 0 refills | Status: AC | PRN

## 2019-08-19 MED ORDER — IOHEXOL 300 MG/ML  SOLN
INTRAMUSCULAR | Status: DC | PRN
  Administered 2019-08-19: 10 mL via ORAL

## 2019-08-19 MED ORDER — CIPROFLOXACIN IN D5W 400 MG/200ML IV SOLN
INTRAVENOUS | Status: AC
  Filled 2019-08-19: qty 200

## 2019-08-19 MED ORDER — ONDANSETRON HCL 4 MG/2ML IJ SOLN
INTRAMUSCULAR | Status: DC | PRN
  Administered 2019-08-19: 4 mg via INTRAVENOUS

## 2019-08-19 MED ORDER — MIDAZOLAM HCL 2 MG/2ML IJ SOLN: 0.5000 mg | Freq: Once | INTRAMUSCULAR | Status: DC | PRN

## 2019-08-19 MED ORDER — EPHEDRINE SULFATE-NACL 50-0.9 MG/10ML-% IV SOSY
PREFILLED_SYRINGE | INTRAVENOUS | Status: DC | PRN
  Administered 2019-08-19: 10 mg via INTRAVENOUS

## 2019-08-19 MED ORDER — LIDOCAINE 2% (20 MG/ML) 5 ML SYRINGE
INTRAMUSCULAR | Status: AC
  Filled 2019-08-19: qty 5

## 2019-08-19 MED ORDER — SODIUM CHLORIDE 0.9 % IR SOLN
Status: DC | PRN
  Administered 2019-08-19: 3000 mL via INTRAVESICAL

## 2019-08-19 MED ORDER — PROMETHAZINE HCL 25 MG/ML IJ SOLN: 6.2500 mg | INTRAMUSCULAR | Status: DC | PRN

## 2019-08-19 MED ORDER — LIDOCAINE 2% (20 MG/ML) 5 ML SYRINGE
INTRAMUSCULAR | Status: DC | PRN
  Administered 2019-08-19: 40 mg via INTRAVENOUS

## 2019-08-19 MED ORDER — CIPROFLOXACIN IN D5W 400 MG/200ML IV SOLN
400.0000 mg | INTRAVENOUS | Status: AC
  Administered 2019-08-19: 400 mg via INTRAVENOUS

## 2019-08-19 MED ORDER — DEXAMETHASONE SODIUM PHOSPHATE 10 MG/ML IJ SOLN
INTRAMUSCULAR | Status: AC
  Filled 2019-08-19: qty 1

## 2019-08-19 MED ORDER — FENTANYL CITRATE (PF) 100 MCG/2ML IJ SOLN
INTRAMUSCULAR | Status: DC | PRN
  Administered 2019-08-19 (×2): 50 ug via INTRAVENOUS

## 2019-08-19 MED ORDER — PROPOFOL 10 MG/ML IV BOLUS
INTRAVENOUS | Status: DC | PRN
  Administered 2019-08-19: 200 mg via INTRAVENOUS
  Administered 2019-08-19: 100 mg via INTRAVENOUS
  Administered 2019-08-19: 30 mg via INTRAVENOUS

## 2019-08-19 MED ORDER — CIPROFLOXACIN HCL 500 MG PO TABS
500.0000 mg | ORAL_TABLET | Freq: Two times a day (BID) | ORAL | 0 refills | Status: AC
Start: 2019-08-19 — End: 2019-08-26

## 2019-08-19 MED ORDER — FENTANYL CITRATE (PF) 100 MCG/2ML IJ SOLN: 25.0000 ug | INTRAMUSCULAR | Status: DC | PRN

## 2019-08-19 MED ORDER — PROPOFOL 10 MG/ML IV BOLUS
INTRAVENOUS | Status: AC
  Filled 2019-08-19: qty 40

## 2019-08-19 MED ORDER — PHENYLEPHRINE 40 MCG/ML (10ML) SYRINGE FOR IV PUSH (FOR BLOOD PRESSURE SUPPORT)
PREFILLED_SYRINGE | INTRAVENOUS | Status: AC
  Filled 2019-08-19: qty 10

## 2019-08-19 SURGICAL SUPPLY — 19 items
BAG DRAIN URO-CYSTO SKYTR STRL (DRAIN) ×3 IMPLANT
BAG DRN UROCATH (DRAIN) ×1
CATH INTERMIT  6FR 70CM (CATHETERS) IMPLANT
CLOTH BEACON ORANGE TIMEOUT ST (SAFETY) ×3 IMPLANT
FIBER LASER FLEXIVA 365 (UROLOGICAL SUPPLIES) IMPLANT
FIBER LASER TRAC TIP (UROLOGICAL SUPPLIES) IMPLANT
GLOVE BIO SURGEON STRL SZ7.5 (GLOVE) ×3 IMPLANT
GOWN STRL REUS W/TWL XL LVL3 (GOWN DISPOSABLE) IMPLANT
GUIDEWIRE STR DUAL SENSOR (WIRE) ×6 IMPLANT
IV NS IRRIG 3000ML ARTHROMATIC (IV SOLUTION) ×3 IMPLANT
KIT TURNOVER CYSTO (KITS) ×3 IMPLANT
MANIFOLD NEPTUNE II (INSTRUMENTS) ×3 IMPLANT
NS IRRIG 500ML POUR BTL (IV SOLUTION) ×3 IMPLANT
PACK CYSTO (CUSTOM PROCEDURE TRAY) ×3 IMPLANT
SHEATH URET ACCESS 12FR/35CM (UROLOGICAL SUPPLIES) ×3 IMPLANT
STENT URET 6FRX26 CONTOUR (STENTS) ×3 IMPLANT
TUBE CONNECTING 12'X1/4 (SUCTIONS)
TUBE CONNECTING 12X1/4 (SUCTIONS) IMPLANT
TUBING UROLOGY SET (TUBING) IMPLANT

## 2019-08-19 NOTE — Anesthesia Procedure Notes (Signed)
Procedure Name: LMA Insertion Date/Time: 08/19/2019 7:50 AM Performed by: Bonney Aid, CRNA Pre-anesthesia Checklist: Patient identified, Emergency Drugs available, Suction available and Patient being monitored Patient Re-evaluated:Patient Re-evaluated prior to induction Oxygen Delivery Method: Circle system utilized Preoxygenation: Pre-oxygenation with 100% oxygen Induction Type: IV induction Ventilation: Mask ventilation without difficulty LMA: LMA inserted LMA Size: 5.0 Number of attempts: 1 Airway Equipment and Method: Bite block Placement Confirmation: positive ETCO2 Dental Injury: Teeth and Oropharynx as per pre-operative assessment

## 2019-08-19 NOTE — Op Note (Addendum)
Operative Note  Preoperative diagnosis:  1.  Left ureteral calculus  Postoperative diagnosis: 1.  Left ureteral calculus  Procedure(s): 1.  Cystoscopy with left retrograde pyelogram, left ureteroscopy with laser lithotripsy, ureteral stent placement  Surgeon: Link Snuffer, MD  Assistants: None  Anesthesia: General  Complications: None immediate  EBL: Minimal  Specimens: 1.  Urine culture  Drains/Catheters: 1.  6 x 26 double-J ureteral stent  Intraoperative findings: 1.  Normal urethra 2.  Normal bladder mucosa 3.  Left retrograde pyelogram revealed no hydronephrosis.  No filling defects. 4.  Ureteroscopy revealed a distal tiny calculus that passed with manipulation with the scope.  There was a renal calculus at the ureteropelvic junction that was pushed into the kidney by retropulsion.  Within the kidney, there was some cloudy urine and therefore I collected it for urine culture.  The stone was about 6 mm and was fragmented to tiny fragments.  It was a soft stone  Indication: 69 year old male failed trial passage of a ureteropelvic junction calculus presents for the previously mentioned operation.  Description of procedure:  The patient was identified and consent was obtained.  The patient was taken to the operating room and placed in the supine position.  The patient was placed under general anesthesia.  Perioperative antibiotics were administered.  The patient was placed in dorsal lithotomy.  Patient was prepped and draped in a standard sterile fashion and a timeout was performed.  A 21 French rigid cystoscope was advanced into the urethra and into the bladder.  Complete cystoscopy was performed with no abnormal findings.  The left ureter was cannulated with a sensor wire which was advanced up to the kidney under fluoroscopic guidance.  A semirigid ureteroscope was advanced alongside the wire.  Within the distal ureter and there was a tiny calculus that passed after inserting the  scope.  I advanced the scope up to the renal pelvis and there was a calculus.  The water pressure from the scope retropulsed the stone to the kidney.  I was able to see the renal pelvis and there was some cloudy urine and therefore I collected this for urine culture.  I performed a retrograde pyelogram with the findings noted above through the scope.  I passed a second sensor wire through the scope and withdrew the scope.  Under continuous fluoroscopic guidance, I passed a 12 x 14 ureteral access sheath over the wire up to the level of the renal pelvis.  The inner sheath along with the wire were withdrawn.  Digital ureteroscopy revealed a 6 mm calculus which was dusted to tiny fragments.  No other stone fragments were seen on complete pyeloscopy.  No other clinically significant stone fragments remained.  I withdrew the scope along with the access sheath visualizing the entire ureter upon removal.  There was no significant injury to the ureter and no ureteral calculus.  I backloaded the wire onto a rigid cystoscope and advanced that into the bladder followed by routine placement of a 6 x 26 double-J ureteral stent.  Fluoroscopy confirmed proximal placement and direct visualization confirmed a good coil within the bladder.  I drained the bladder and withdrew the scope.  Patient tolerated procedure well and was stable postoperatively.  Plan: Follow-up in 1 week for stent removal.  I will send him home with 7 days of ciprofloxacin given the finding of some cloudy urine in the renal pelvis.

## 2019-08-19 NOTE — Transfer of Care (Signed)
Immediate Anesthesia Transfer of Care Note  Patient: Timothy Ray [facility]  Procedure(s) Performed: CYSTOSCOPY LEFT RETROGRADE PYELOGRAM URETEROSCOPY/HOLMIUM LASER/STENT PLACEMENT AND URINE CULTURES (Left )  Patient Location: PACU  Anesthesia Type:General  Level of Consciousness: awake, alert  and oriented  Airway & Oxygen Therapy: Patient Spontanous Breathing and Patient connected to nasal cannula oxygen  Post-op Assessment: Report given to RN  Post vital signs: Reviewed and stable  Last Vitals:  Vitals Value Taken Time  BP 125/63 08/19/19 0835  Temp    Pulse 80 08/19/19 0835  Resp 17 08/19/19 0835  SpO2 98 % 08/19/19 0835  Vitals shown include unvalidated device data.  Last Pain:  Vitals:   08/19/19 0618  TempSrc: Oral  PainSc: 0-No pain      Patients Stated Pain Goal: 5 (83/29/19 1660)  Complications: No complications documented.

## 2019-08-19 NOTE — H&P (Signed)
H&P  Chief Complaint: Left ureteral calculus  History of Present Illness: 69 year old male failed trial passage of a 4 mm left ureteral calculus presents for ureteroscopy.  Past Medical History:  Diagnosis Date  . CKD (chronic kidney disease), stage III    stage 3 a per dr Neta Ehlers [facility] nephrology note [phone]  . Diabetes mellitus without complication ([place])    type 2  . Foot ulcer ([place])    healed left  . GERD (gastroesophageal reflux disease)   . History of kidney stones    2018  . Hypertension   . Nephrolithiasis, uric acid   . Renal cyst, left   . Toe ulcer ([place]) 05/2017   Left   Past Surgical History:  Procedure Laterality Date  . AMPUTATION TOE Left 05/11/2017   Procedure: AMPUTATION LEFT THIRD TOE;  Surgeon: Rosemary Holms, DPM;  Location: [facility];  Service: Podiatry;  Laterality: Left;  . APPENDECTOMY     69 years old  . IR URETERAL STENT LEFT NEW ACCESS W/O SEP NEPHROSTOMY CATH  10/13/2016  . NEPHROLITHOTOMY Left 10/13/2016   Procedure: NEPHROLITHOTOMY PERCUTANEOUS;  Surgeon: Irine Seal, MD;  Location[facility];  Service: Urology;  Laterality: Left;  ONLY NEEDS 90 MINUTES FOR PROCEDURE    Home Medications:  Medications Prior to Admission  Medication Sig Dispense Refill Last Dose  . allopurinol (ZYLOPRIM) 100 MG tablet Take 100 mg by mouth daily.   08/19/2019 at 0430  . aspirin EC 81 MG tablet Take 81 mg by mouth daily.   Past Week at Unknown time  . atorvastatin (LIPITOR) 80 MG tablet Take 80 mg by mouth daily.   08/19/2019 at 0430  . Cholecalciferol (VITAMIN D3) 5000 units CAPS Take by mouth.   08/18/2019 at Unknown time  . furosemide (LASIX) 20 MG tablet Take 20 mg by mouth daily.   08/18/2019 at Unknown time  . glipiZIDE (GLUCOTROL XL) 2.5 MG 24 hr tablet Take 2.5 mg by mouth daily with breakfast.   08/18/2019 at Unknown time  . losartan (COZAAR) 25 MG tablet Take 50 mg by mouth daily.    08/18/2019 at Unknown time  . metFORMIN (GLUCOPHAGE) 1000  MG tablet Take 1,000 mg by mouth 2 (two) times daily.   08/18/2019 at Unknown time  . modafinil (PROVIGIL) 200 MG tablet Take 100-200 mg by mouth daily as needed (for fatigue). 0.5-1 tablet depending on fatigue   08/18/2019 at Unknown time  . nebivolol (BYSTOLIC) 10 MG tablet Take 10 mg by mouth daily.   08/18/2019 at 0430  . omeprazole (PRILOSEC OTC) 20 MG tablet Take 20 mg by mouth daily as needed (for acid reflex).   08/18/2019 at Unknown time  . Semaglutide,0.25 or 0.5MG /DOS, (OZEMPIC, 0.25 OR 0.5 MG/DOSE,) 2 MG/1.5ML SOPN Inject into the skin. mondays   Past Week at Unknown time  . tamsulosin (FLOMAX) 0.4 MG CAPS capsule Take 0.4 mg by mouth daily after supper.   08/18/2019 at Unknown time  . acetaminophen (TYLENOL) 650 MG CR tablet Take 1,300 mg by mouth every 8 (eight) hours.       Allergies:  Allergies  Allergen Reactions  . Penicillins Rash    Has patient had a PCN reaction causing immediate rash, facial/tongue/throat swelling, SOB or lightheadedness with hypotension: Yes Has patient had a PCN reaction causing severe rash involving mucus membranes or skin necrosis: No Has patient had a PCN reaction that required hospitalization: Yes - was already in the hospital when reaction happened Has patient had  a PCN reaction occurring within the last 10 years: No If all of the above answers are "NO", then may proceed with Cephalosporin use.     History reviewed. No pertinent family history. Social History:  reports that he has been smoking cigarettes. He has smoked for the past 2.00 years. He has never used smokeless tobacco. He reports current alcohol use. He reports that he does not use drugs.  ROS: A complete review of systems was performed.  All systems are negative except for pertinent findings as noted. ROS   Physical Exam:  Vital signs in last 24 hours: Temp:  [97.8 F (36.6 C)] 97.8 F (36.6 C) (08/16 0618) Pulse Rate:  [89] 89 (08/16 0618) Resp:  [16] 16 (08/16 0618) BP:  (144)/(66) 144/66 (08/16 0618) SpO2:  [100 %] 100 % (08/16 0618) Weight:  [82.1 kg] 82.1 kg (08/16 0618) General:  Alert and oriented, No acute distress HEENT: Normocephalic, atraumatic Neck: No JVD or lymphadenopathy Cardiovascular: Regular rate and rhythm Lungs: Regular rate and effort Abdomen: Soft, nontender, nondistended, no abdominal masses Back: No CVA tenderness Extremities: No edema Neurologic: Grossly intact  Laboratory Data:  Results for orders placed or performed during the hospital encounter of 08/19/19 (from the past 24 hour(s))  I-STAT, chem 8     Status: Abnormal   Collection Time: 08/19/19  6:11 AM  Result Value Ref Range   Sodium 137 135 - 145 mmol/L   Potassium 4.1 3.5 - 5.1 mmol/L   Chloride 103 98 - 111 mmol/L   BUN 38 (H) 8 - 23 mg/dL   Creatinine, Ser 3.40 (H) 0.61 - 1.24 mg/dL   Glucose, Bld 139 (H) 70 - 99 mg/dL   Calcium, Ion 1.30 1.15 - 1.40 mmol/L   TCO2 19 (L) 22 - 32 mmol/L   Hemoglobin 10.5 (L) 13.0 - 17.0 g/dL   HCT 31.0 (L) 39 - 52 %   Recent Results (from the past 240 hour(s))  SARS CORONAVIRUS 2 (TAT 6-24 HRS) Nasopharyngeal Nasopharyngeal Swab     Status: None   Collection Time: 08/16/19  2:19 PM   Specimen: Nasopharyngeal Swab  Result Value Ref Range Status   SARS Coronavirus 2 NEGATIVE NEGATIVE Final    Comment: (NOTE) SARS-CoV-2 target nucleic acids are NOT DETECTED.  The SARS-CoV-2 RNA is generally detectable in upper and lower respiratory specimens during the acute phase of infection. Negative results do not preclude SARS-CoV-2 infection, do not rule out co-infections with other pathogens, and should not be used as the sole basis for treatment or other patient management decisions. Negative results must be combined with clinical observations, patient history, and epidemiological information. The expected result is Negative.  Fact Sheet for Patients: SugarRoll.be  Fact Sheet for Healthcare  Providers: https://www.woods-mathews.com/  This test is not yet approved or cleared by the [place] FDA and  has been authorized for detection and/or diagnosis of SARS-CoV-2 by FDA under an Emergency Use Authorization (EUA). This EUA will remain  in effect (meaning this test can be used) for the duration of the COVID-19 declaration under Se ction 564(b)(1) of the Act, 21 U.S.C. section 360bbb-3(b)(1), unless the authorization is terminated or revoked sooner.  Performed at [facility], [place] [address] [place], [place] [address]    Creatinine: Recent Labs    08/19/19 3149  CREATININE 3.40*    Impression/Assessment:  Left ureteral calculus  Plan:  Proceed with cystoscopy with left ureteroscopy, laser lithotripsy, ureteral stent placement.  Risk and benefits discussed.  Timothy Ray, III 08/19/2019, 7:35 AM

## 2019-08-19 NOTE — Anesthesia Postprocedure Evaluation (Signed)
Anesthesia Post Note  Patient: Sahil Milner [facility]  Procedure(s) Performed: CYSTOSCOPY LEFT RETROGRADE PYELOGRAM URETEROSCOPY/HOLMIUM LASER/STENT PLACEMENT AND URINE CULTURES (Left )     Patient location during evaluation: PACU Anesthesia Type: General Level of consciousness: awake and alert, oriented and patient cooperative Pain management: pain level controlled Vital Signs Assessment: post-procedure vital signs reviewed and stable Respiratory status: spontaneous breathing, nonlabored ventilation and respiratory function stable Cardiovascular status: blood pressure returned to baseline and stable Postop Assessment: no apparent nausea or vomiting and adequate PO intake Anesthetic complications: no   No complications documented.  Last Vitals:  Vitals:   08/19/19 0915 08/19/19 0952  BP: 126/64 134/67  Pulse: 78 75  Resp: 14 14  Temp: (!) 36.4 C 36.7 C  SpO2: 96% 99%    Last Pain:  Vitals:   08/19/19 0945  TempSrc:   PainSc: 0-No pain                 Jeris Roser,E. Gerald Kuehl

## 2019-08-19 NOTE — Anesthesia Preprocedure Evaluation (Addendum)
Anesthesia Evaluation  Patient identified by MRN, date of birth, ID band Patient awake    Reviewed: Allergy & Precautions, NPO status , Patient's Chart, lab work & pertinent test results, reviewed documented beta blocker date and time   History of Anesthesia Complications Negative for: history of anesthetic complications  Airway Mallampati: I  TM Distance: >3 FB Neck ROM: Full    Dental  (+) Dental Advisory Given   Pulmonary Current Smoker and Patient abstained from smoking.,  08/16/2019 SARS coronavirus NEG   breath sounds clear to auscultation       Cardiovascular hypertension, Pt. on medications and Pt. on home beta blockers negative cardio ROS   Rhythm:Regular Rate:Normal     Neuro/Psych negative neurological ROS     GI/Hepatic Neg liver ROS, GERD  Medicated and Controlled,  Endo/Other  diabetes (glu 139), Oral Hypoglycemic Agents  Renal/GU Renal InsufficiencyRenal disease (creat 3.4)stones     Musculoskeletal   Abdominal   Peds  Hematology negative hematology ROS (+)   Anesthesia Other Findings   Reproductive/Obstetrics                            Anesthesia Physical Anesthesia Plan  ASA: III  Anesthesia Plan: General   Post-op Pain Management:    Induction: Intravenous  PONV Risk Score and Plan: 1 and Ondansetron and Dexamethasone  Airway Management Planned: LMA  Additional Equipment:   Intra-op Plan:   Post-operative Plan:   Informed Consent: I have reviewed the patients History and Physical, chart, labs and discussed the procedure including the risks, benefits and alternatives for the proposed anesthesia with the patient or authorized representative who has indicated his/her understanding and acceptance.     Dental advisory given  Plan Discussed with: CRNA and Surgeon  Anesthesia Plan Comments:        Anesthesia Quick Evaluation

## 2019-08-19 NOTE — Discharge Instructions (Addendum)
[facility] [phone] Post Ureteroscopy With or Without Stent Instructions  Definitions:  Ureter: The duct that transports urine from the kidney to the bladder. Stent:   A plastic hollow tube that is placed into the ureter, from the kidney to the                 bladder to prevent the ureter from swelling shut.  GENERAL INSTRUCTIONS:  Despite the fact that no skin incisions were used, the area around the ureter and bladder is raw and irritated. The stent is a foreign body which will further irritate the bladder wall. This irritation is manifested by increased frequency of urination, both day and night, and by an increase in the urge to urinate. In some, the urge to urinate is present almost always. Sometimes the urge is strong enough that you may not be able to stop yourself from urinating. The only real cure is to remove the stent and then give time for the bladder wall to heal which can't be done until the danger of the ureter swelling shut has passed, which varies.  You may see some blood in your urine while the stent is in place and a few days afterwards. Do not be alarmed, even if the urine was clear for a while. Get off your feet and drink lots of fluids until clearing occurs. If you start to pass clots or don't improve, call us.  DIET: You may return to your normal diet immediately. Because of the raw surface of your bladder, alcohol, spicy foods, acid type foods and drinks with caffeine may cause irritation or frequency and should be used in moderation. To keep your urine flowing freely and to avoid constipation, drink plenty of fluids during the day ( 8-10 glasses ). Tip: Avoid cranberry juice because it is very acidic.  ACTIVITY: Your physical activity doesn't need to be restricted. However, if you are very active, you may see some blood in your urine. We suggest that you reduce your activity under these circumstances until the bleeding has stopped.  BOWELS: It is  important to keep your bowels regular during the postoperative period. Straining with bowel movements can cause bleeding. A bowel movement every other day is reasonable. Use a mild laxative if needed, such as Milk of Magnesia 2-3 tablespoons, or 2 Dulcolax tablets. Call if you continue to have problems. If you have been taking narcotics for pain, before, during or after your surgery, you may be constipated. Take a laxative if necessary.   MEDICATION: You should resume your pre-surgery medications unless told not to. You may take oxybutynin or flomax if prescribed for bladder spasms or discomfort from the stent Take pain medication as directed for pain refractory to conservative management  PROBLEMS YOU SHOULD REPORT TO [place]:  Fevers over 100.5 Fahrenheit.  Heavy bleeding, or clots ( See above notes about blood in urine ).  Inability to urinate.  Drug reactions ( hives, rash, nausea, vomiting, diarrhea ).  Severe burning or pain with urination that is not improving.  Post Anesthesia Home Care Instructions  Activity: Get plenty of rest for the remainder of the day. A responsible adult should stay with you for 24 hours following the procedure.  For the next 24 hours, DO NOT: -Drive a car -Paediatric nurse -Drink alcoholic beverages -Take any medication unless instructed by your physician -Make any legal decisions or sign important papers.  Meals: Start with liquid foods such as gelatin or soup. Progress to regular foods as  tolerated. Avoid greasy, spicy, heavy foods. If nausea and/or vomiting occur, drink only clear liquids until the nausea and/or vomiting subsides. Call your physician if vomiting continues.  Special Instructions/Symptoms: Your throat may feel dry or sore from the anesthesia or the breathing tube placed in your throat during surgery. If this causes discomfort, gargle with warm salt water. The discomfort should disappear within 24 hours.  If you had a scopolamine  patch placed behind your ear for the management of post- operative nausea and/or vomiting:  1. The medication in the patch is effective for 72 hours, after which it should be removed.  Wrap patch in a tissue and discard in the trash. Wash hands thoroughly with soap and water. 2. You may remove the patch earlier than 72 hours if you experience unpleasant side effects which may include dry mouth, dizziness or visual disturbances. 3. Avoid touching the patch. Wash your hands with soap and water after contact with the patch.

## 2019-08-20 LAB — URINE CULTURE: Culture: NO GROWTH

## 2019-08-21 ENCOUNTER — Encounter (HOSPITAL_BASED_OUTPATIENT_CLINIC_OR_DEPARTMENT_OTHER): Payer: Self-pay | Admitting: Urology

## 2019-08-26 DIAGNOSIS — N201 Calculus of ureter: Secondary | ICD-10-CM | POA: Diagnosis not present

## 2019-10-01 DIAGNOSIS — E7800 Pure hypercholesterolemia, unspecified: Secondary | ICD-10-CM | POA: Diagnosis not present

## 2019-10-01 DIAGNOSIS — N184 Chronic kidney disease, stage 4 (severe): Secondary | ICD-10-CM | POA: Diagnosis not present

## 2019-10-01 DIAGNOSIS — E1121 Type 2 diabetes mellitus with diabetic nephropathy: Secondary | ICD-10-CM | POA: Diagnosis not present

## 2019-10-01 DIAGNOSIS — R5383 Other fatigue: Secondary | ICD-10-CM | POA: Diagnosis not present

## 2019-10-04 DIAGNOSIS — Z23 Encounter for immunization: Secondary | ICD-10-CM | POA: Diagnosis not present

## 2019-10-04 DIAGNOSIS — H35033 Hypertensive retinopathy, bilateral: Secondary | ICD-10-CM | POA: Diagnosis not present

## 2019-10-04 DIAGNOSIS — I10 Essential (primary) hypertension: Secondary | ICD-10-CM | POA: Diagnosis not present

## 2019-10-04 DIAGNOSIS — E1121 Type 2 diabetes mellitus with diabetic nephropathy: Secondary | ICD-10-CM | POA: Diagnosis not present

## 2019-10-04 DIAGNOSIS — R5383 Other fatigue: Secondary | ICD-10-CM | POA: Diagnosis not present

## 2019-10-04 DIAGNOSIS — E7800 Pure hypercholesterolemia, unspecified: Secondary | ICD-10-CM | POA: Diagnosis not present

## 2019-10-04 DIAGNOSIS — Z0001 Encounter for general adult medical examination with abnormal findings: Secondary | ICD-10-CM | POA: Diagnosis not present

## 2019-10-04 DIAGNOSIS — N184 Chronic kidney disease, stage 4 (severe): Secondary | ICD-10-CM | POA: Diagnosis not present

## 2019-10-11 DIAGNOSIS — N200 Calculus of kidney: Secondary | ICD-10-CM | POA: Diagnosis not present

## 2019-10-18 ENCOUNTER — Other Ambulatory Visit: Payer: Self-pay

## 2019-10-18 ENCOUNTER — Encounter (INDEPENDENT_AMBULATORY_CARE_PROVIDER_SITE_OTHER): Payer: Medicare Other | Admitting: Ophthalmology

## 2019-10-18 DIAGNOSIS — H353122 Nonexudative age-related macular degeneration, left eye, intermediate dry stage: Secondary | ICD-10-CM | POA: Diagnosis not present

## 2019-10-18 DIAGNOSIS — E113393 Type 2 diabetes mellitus with moderate nonproliferative diabetic retinopathy without macular edema, bilateral: Secondary | ICD-10-CM | POA: Diagnosis not present

## 2019-10-18 DIAGNOSIS — E11319 Type 2 diabetes mellitus with unspecified diabetic retinopathy without macular edema: Secondary | ICD-10-CM

## 2019-10-18 DIAGNOSIS — H35371 Puckering of macula, right eye: Secondary | ICD-10-CM | POA: Diagnosis not present

## 2019-10-18 DIAGNOSIS — H43813 Vitreous degeneration, bilateral: Secondary | ICD-10-CM | POA: Diagnosis not present

## 2019-10-18 DIAGNOSIS — H35033 Hypertensive retinopathy, bilateral: Secondary | ICD-10-CM

## 2019-10-18 DIAGNOSIS — Z0001 Encounter for general adult medical examination with abnormal findings: Secondary | ICD-10-CM | POA: Diagnosis not present

## 2019-10-18 DIAGNOSIS — H33302 Unspecified retinal break, left eye: Secondary | ICD-10-CM | POA: Diagnosis not present

## 2019-10-18 DIAGNOSIS — I10 Essential (primary) hypertension: Secondary | ICD-10-CM

## 2019-11-08 ENCOUNTER — Encounter (INDEPENDENT_AMBULATORY_CARE_PROVIDER_SITE_OTHER): Payer: Medicare Other | Admitting: Ophthalmology

## 2019-11-08 ENCOUNTER — Other Ambulatory Visit: Payer: Self-pay

## 2019-11-08 DIAGNOSIS — Z89422 Acquired absence of other left toe(s): Secondary | ICD-10-CM | POA: Diagnosis not present

## 2019-11-08 DIAGNOSIS — M14672 Charcot's joint, left ankle and foot: Secondary | ICD-10-CM | POA: Diagnosis not present

## 2019-11-08 DIAGNOSIS — L603 Nail dystrophy: Secondary | ICD-10-CM | POA: Diagnosis not present

## 2019-11-08 DIAGNOSIS — H33302 Unspecified retinal break, left eye: Secondary | ICD-10-CM

## 2019-11-08 DIAGNOSIS — E1142 Type 2 diabetes mellitus with diabetic polyneuropathy: Secondary | ICD-10-CM | POA: Diagnosis not present

## 2019-11-15 DIAGNOSIS — Z23 Encounter for immunization: Secondary | ICD-10-CM | POA: Diagnosis not present

## 2019-11-18 DIAGNOSIS — N1832 Chronic kidney disease, stage 3b: Secondary | ICD-10-CM | POA: Diagnosis not present

## 2019-11-18 DIAGNOSIS — D631 Anemia in chronic kidney disease: Secondary | ICD-10-CM | POA: Diagnosis not present

## 2019-11-22 ENCOUNTER — Other Ambulatory Visit: Payer: Self-pay

## 2019-11-22 ENCOUNTER — Encounter (INDEPENDENT_AMBULATORY_CARE_PROVIDER_SITE_OTHER): Payer: Self-pay | Admitting: Ophthalmology

## 2019-11-22 DIAGNOSIS — I129 Hypertensive chronic kidney disease with stage 1 through stage 4 chronic kidney disease, or unspecified chronic kidney disease: Secondary | ICD-10-CM | POA: Diagnosis not present

## 2019-11-22 DIAGNOSIS — E1122 Type 2 diabetes mellitus with diabetic chronic kidney disease: Secondary | ICD-10-CM | POA: Diagnosis not present

## 2019-11-22 DIAGNOSIS — N184 Chronic kidney disease, stage 4 (severe): Secondary | ICD-10-CM | POA: Diagnosis not present

## 2019-11-22 DIAGNOSIS — D631 Anemia in chronic kidney disease: Secondary | ICD-10-CM | POA: Diagnosis not present

## 2019-11-22 DIAGNOSIS — N200 Calculus of kidney: Secondary | ICD-10-CM | POA: Diagnosis not present

## 2019-11-22 DIAGNOSIS — H33302 Unspecified retinal break, left eye: Secondary | ICD-10-CM

## 2020-01-27 DIAGNOSIS — L603 Nail dystrophy: Secondary | ICD-10-CM | POA: Diagnosis not present

## 2020-01-27 DIAGNOSIS — I739 Peripheral vascular disease, unspecified: Secondary | ICD-10-CM | POA: Diagnosis not present

## 2020-01-27 DIAGNOSIS — Z89422 Acquired absence of other left toe(s): Secondary | ICD-10-CM | POA: Diagnosis not present

## 2020-01-27 DIAGNOSIS — E1142 Type 2 diabetes mellitus with diabetic polyneuropathy: Secondary | ICD-10-CM | POA: Diagnosis not present

## 2020-01-27 DIAGNOSIS — M14672 Charcot's joint, left ankle and foot: Secondary | ICD-10-CM | POA: Diagnosis not present

## 2020-02-17 DIAGNOSIS — N184 Chronic kidney disease, stage 4 (severe): Secondary | ICD-10-CM | POA: Diagnosis not present

## 2020-02-17 DIAGNOSIS — D631 Anemia in chronic kidney disease: Secondary | ICD-10-CM | POA: Diagnosis not present

## 2020-02-28 DIAGNOSIS — E1122 Type 2 diabetes mellitus with diabetic chronic kidney disease: Secondary | ICD-10-CM | POA: Diagnosis not present

## 2020-02-28 DIAGNOSIS — N184 Chronic kidney disease, stage 4 (severe): Secondary | ICD-10-CM | POA: Diagnosis not present

## 2020-02-28 DIAGNOSIS — I129 Hypertensive chronic kidney disease with stage 1 through stage 4 chronic kidney disease, or unspecified chronic kidney disease: Secondary | ICD-10-CM | POA: Diagnosis not present

## 2020-02-28 DIAGNOSIS — D631 Anemia in chronic kidney disease: Secondary | ICD-10-CM | POA: Diagnosis not present

## 2020-03-06 DIAGNOSIS — D485 Neoplasm of uncertain behavior of skin: Secondary | ICD-10-CM | POA: Diagnosis not present

## 2020-03-06 DIAGNOSIS — B078 Other viral warts: Secondary | ICD-10-CM | POA: Diagnosis not present

## 2020-03-20 ENCOUNTER — Encounter (INDEPENDENT_AMBULATORY_CARE_PROVIDER_SITE_OTHER): Payer: Medicare Other | Admitting: Ophthalmology

## 2020-03-25 DIAGNOSIS — N184 Chronic kidney disease, stage 4 (severe): Secondary | ICD-10-CM | POA: Diagnosis not present

## 2020-03-25 DIAGNOSIS — D631 Anemia in chronic kidney disease: Secondary | ICD-10-CM | POA: Diagnosis not present

## 2020-03-27 DIAGNOSIS — N184 Chronic kidney disease, stage 4 (severe): Secondary | ICD-10-CM | POA: Diagnosis not present

## 2020-03-27 DIAGNOSIS — D631 Anemia in chronic kidney disease: Secondary | ICD-10-CM | POA: Diagnosis not present

## 2020-04-10 ENCOUNTER — Other Ambulatory Visit: Payer: Self-pay

## 2020-04-10 ENCOUNTER — Encounter (INDEPENDENT_AMBULATORY_CARE_PROVIDER_SITE_OTHER): Payer: Medicare Other | Admitting: Ophthalmology

## 2020-04-10 DIAGNOSIS — H43813 Vitreous degeneration, bilateral: Secondary | ICD-10-CM

## 2020-04-10 DIAGNOSIS — H353122 Nonexudative age-related macular degeneration, left eye, intermediate dry stage: Secondary | ICD-10-CM

## 2020-04-10 DIAGNOSIS — I10 Essential (primary) hypertension: Secondary | ICD-10-CM

## 2020-04-10 DIAGNOSIS — H35033 Hypertensive retinopathy, bilateral: Secondary | ICD-10-CM

## 2020-04-10 DIAGNOSIS — H33303 Unspecified retinal break, bilateral: Secondary | ICD-10-CM | POA: Diagnosis not present

## 2020-04-15 DIAGNOSIS — L603 Nail dystrophy: Secondary | ICD-10-CM | POA: Diagnosis not present

## 2020-04-15 DIAGNOSIS — I739 Peripheral vascular disease, unspecified: Secondary | ICD-10-CM | POA: Diagnosis not present

## 2020-04-21 DIAGNOSIS — N184 Chronic kidney disease, stage 4 (severe): Secondary | ICD-10-CM | POA: Diagnosis not present

## 2020-04-21 DIAGNOSIS — D631 Anemia in chronic kidney disease: Secondary | ICD-10-CM | POA: Diagnosis not present

## 2020-04-22 DIAGNOSIS — Z23 Encounter for immunization: Secondary | ICD-10-CM | POA: Diagnosis not present

## 2020-04-24 DIAGNOSIS — N184 Chronic kidney disease, stage 4 (severe): Secondary | ICD-10-CM | POA: Diagnosis not present

## 2020-04-24 DIAGNOSIS — D631 Anemia in chronic kidney disease: Secondary | ICD-10-CM | POA: Diagnosis not present

## 2020-04-28 DIAGNOSIS — Z79899 Other long term (current) drug therapy: Secondary | ICD-10-CM | POA: Diagnosis not present

## 2020-04-28 DIAGNOSIS — E1121 Type 2 diabetes mellitus with diabetic nephropathy: Secondary | ICD-10-CM | POA: Diagnosis not present

## 2020-05-01 DIAGNOSIS — I10 Essential (primary) hypertension: Secondary | ICD-10-CM | POA: Diagnosis not present

## 2020-05-01 DIAGNOSIS — E1121 Type 2 diabetes mellitus with diabetic nephropathy: Secondary | ICD-10-CM | POA: Diagnosis not present

## 2020-05-18 DIAGNOSIS — N184 Chronic kidney disease, stage 4 (severe): Secondary | ICD-10-CM | POA: Diagnosis not present

## 2020-05-22 DIAGNOSIS — D631 Anemia in chronic kidney disease: Secondary | ICD-10-CM | POA: Diagnosis not present

## 2020-05-22 DIAGNOSIS — E1122 Type 2 diabetes mellitus with diabetic chronic kidney disease: Secondary | ICD-10-CM | POA: Diagnosis not present

## 2020-05-22 DIAGNOSIS — I129 Hypertensive chronic kidney disease with stage 1 through stage 4 chronic kidney disease, or unspecified chronic kidney disease: Secondary | ICD-10-CM | POA: Diagnosis not present

## 2020-05-22 DIAGNOSIS — Z7984 Long term (current) use of oral hypoglycemic drugs: Secondary | ICD-10-CM | POA: Diagnosis not present

## 2020-05-22 DIAGNOSIS — Z79899 Other long term (current) drug therapy: Secondary | ICD-10-CM | POA: Diagnosis not present

## 2020-05-22 DIAGNOSIS — N184 Chronic kidney disease, stage 4 (severe): Secondary | ICD-10-CM | POA: Diagnosis not present

## 2020-06-16 DIAGNOSIS — N184 Chronic kidney disease, stage 4 (severe): Secondary | ICD-10-CM | POA: Diagnosis not present

## 2020-06-16 DIAGNOSIS — D631 Anemia in chronic kidney disease: Secondary | ICD-10-CM | POA: Diagnosis not present

## 2020-06-18 DIAGNOSIS — D631 Anemia in chronic kidney disease: Secondary | ICD-10-CM | POA: Diagnosis not present

## 2020-06-18 DIAGNOSIS — N184 Chronic kidney disease, stage 4 (severe): Secondary | ICD-10-CM | POA: Diagnosis not present

## 2020-07-03 DIAGNOSIS — Z20822 Contact with and (suspected) exposure to covid-19: Secondary | ICD-10-CM | POA: Diagnosis not present

## 2020-07-14 DIAGNOSIS — N184 Chronic kidney disease, stage 4 (severe): Secondary | ICD-10-CM | POA: Diagnosis not present

## 2020-07-14 DIAGNOSIS — D631 Anemia in chronic kidney disease: Secondary | ICD-10-CM | POA: Diagnosis not present

## 2020-07-16 DIAGNOSIS — I739 Peripheral vascular disease, unspecified: Secondary | ICD-10-CM | POA: Diagnosis not present

## 2020-07-16 DIAGNOSIS — L603 Nail dystrophy: Secondary | ICD-10-CM | POA: Diagnosis not present

## 2020-07-16 DIAGNOSIS — S9031XA Contusion of right foot, initial encounter: Secondary | ICD-10-CM | POA: Diagnosis not present

## 2020-07-16 DIAGNOSIS — E1142 Type 2 diabetes mellitus with diabetic polyneuropathy: Secondary | ICD-10-CM | POA: Diagnosis not present

## 2020-07-16 DIAGNOSIS — M205X1 Other deformities of toe(s) (acquired), right foot: Secondary | ICD-10-CM | POA: Diagnosis not present

## 2020-07-17 DIAGNOSIS — N184 Chronic kidney disease, stage 4 (severe): Secondary | ICD-10-CM | POA: Diagnosis not present

## 2020-07-17 DIAGNOSIS — D631 Anemia in chronic kidney disease: Secondary | ICD-10-CM | POA: Diagnosis not present

## 2020-08-10 DIAGNOSIS — D631 Anemia in chronic kidney disease: Secondary | ICD-10-CM | POA: Diagnosis not present

## 2020-08-10 DIAGNOSIS — N184 Chronic kidney disease, stage 4 (severe): Secondary | ICD-10-CM | POA: Diagnosis not present

## 2020-08-13 DIAGNOSIS — E1142 Type 2 diabetes mellitus with diabetic polyneuropathy: Secondary | ICD-10-CM | POA: Diagnosis not present

## 2020-08-13 DIAGNOSIS — M205X1 Other deformities of toe(s) (acquired), right foot: Secondary | ICD-10-CM | POA: Diagnosis not present

## 2020-08-13 DIAGNOSIS — L603 Nail dystrophy: Secondary | ICD-10-CM | POA: Diagnosis not present

## 2020-08-13 DIAGNOSIS — Z89422 Acquired absence of other left toe(s): Secondary | ICD-10-CM | POA: Diagnosis not present

## 2020-08-14 DIAGNOSIS — N184 Chronic kidney disease, stage 4 (severe): Secondary | ICD-10-CM | POA: Diagnosis not present

## 2020-08-14 DIAGNOSIS — D631 Anemia in chronic kidney disease: Secondary | ICD-10-CM | POA: Diagnosis not present

## 2020-09-08 DIAGNOSIS — N184 Chronic kidney disease, stage 4 (severe): Secondary | ICD-10-CM | POA: Diagnosis not present

## 2020-09-10 DIAGNOSIS — Z7984 Long term (current) use of oral hypoglycemic drugs: Secondary | ICD-10-CM | POA: Diagnosis not present

## 2020-09-10 DIAGNOSIS — N184 Chronic kidney disease, stage 4 (severe): Secondary | ICD-10-CM | POA: Diagnosis not present

## 2020-09-10 DIAGNOSIS — E1122 Type 2 diabetes mellitus with diabetic chronic kidney disease: Secondary | ICD-10-CM | POA: Diagnosis not present

## 2020-09-10 DIAGNOSIS — I129 Hypertensive chronic kidney disease with stage 1 through stage 4 chronic kidney disease, or unspecified chronic kidney disease: Secondary | ICD-10-CM | POA: Diagnosis not present

## 2020-09-10 DIAGNOSIS — Z79899 Other long term (current) drug therapy: Secondary | ICD-10-CM | POA: Diagnosis not present

## 2020-09-10 DIAGNOSIS — Z87891 Personal history of nicotine dependence: Secondary | ICD-10-CM | POA: Diagnosis not present

## 2020-09-10 DIAGNOSIS — D631 Anemia in chronic kidney disease: Secondary | ICD-10-CM | POA: Diagnosis not present

## 2020-09-11 DIAGNOSIS — Z23 Encounter for immunization: Secondary | ICD-10-CM | POA: Diagnosis not present

## 2020-10-01 DIAGNOSIS — E1151 Type 2 diabetes mellitus with diabetic peripheral angiopathy without gangrene: Secondary | ICD-10-CM | POA: Diagnosis not present

## 2020-10-01 DIAGNOSIS — L602 Onychogryphosis: Secondary | ICD-10-CM | POA: Diagnosis not present

## 2020-10-06 DIAGNOSIS — D631 Anemia in chronic kidney disease: Secondary | ICD-10-CM | POA: Diagnosis not present

## 2020-10-06 DIAGNOSIS — N184 Chronic kidney disease, stage 4 (severe): Secondary | ICD-10-CM | POA: Diagnosis not present

## 2020-10-09 DIAGNOSIS — N184 Chronic kidney disease, stage 4 (severe): Secondary | ICD-10-CM | POA: Diagnosis not present

## 2020-10-09 DIAGNOSIS — D631 Anemia in chronic kidney disease: Secondary | ICD-10-CM | POA: Diagnosis not present

## 2020-11-03 DIAGNOSIS — D631 Anemia in chronic kidney disease: Secondary | ICD-10-CM | POA: Diagnosis not present

## 2020-11-03 DIAGNOSIS — N184 Chronic kidney disease, stage 4 (severe): Secondary | ICD-10-CM | POA: Diagnosis not present

## 2020-11-05 DIAGNOSIS — Z23 Encounter for immunization: Secondary | ICD-10-CM | POA: Diagnosis not present

## 2020-11-06 DIAGNOSIS — N184 Chronic kidney disease, stage 4 (severe): Secondary | ICD-10-CM | POA: Diagnosis not present

## 2020-11-06 DIAGNOSIS — D631 Anemia in chronic kidney disease: Secondary | ICD-10-CM | POA: Diagnosis not present

## 2020-11-30 DIAGNOSIS — N184 Chronic kidney disease, stage 4 (severe): Secondary | ICD-10-CM | POA: Diagnosis not present

## 2020-12-01 DIAGNOSIS — E1121 Type 2 diabetes mellitus with diabetic nephropathy: Secondary | ICD-10-CM | POA: Diagnosis not present

## 2020-12-01 DIAGNOSIS — Z125 Encounter for screening for malignant neoplasm of prostate: Secondary | ICD-10-CM | POA: Diagnosis not present

## 2020-12-01 DIAGNOSIS — Z0000 Encounter for general adult medical examination without abnormal findings: Secondary | ICD-10-CM | POA: Diagnosis not present

## 2020-12-01 DIAGNOSIS — E7800 Pure hypercholesterolemia, unspecified: Secondary | ICD-10-CM | POA: Diagnosis not present

## 2020-12-04 DIAGNOSIS — I129 Hypertensive chronic kidney disease with stage 1 through stage 4 chronic kidney disease, or unspecified chronic kidney disease: Secondary | ICD-10-CM | POA: Diagnosis not present

## 2020-12-04 DIAGNOSIS — E1121 Type 2 diabetes mellitus with diabetic nephropathy: Secondary | ICD-10-CM | POA: Diagnosis not present

## 2020-12-04 DIAGNOSIS — Z7985 Long-term (current) use of injectable non-insulin antidiabetic drugs: Secondary | ICD-10-CM | POA: Diagnosis not present

## 2020-12-04 DIAGNOSIS — Z0001 Encounter for general adult medical examination with abnormal findings: Secondary | ICD-10-CM | POA: Diagnosis not present

## 2020-12-04 DIAGNOSIS — N184 Chronic kidney disease, stage 4 (severe): Secondary | ICD-10-CM | POA: Diagnosis not present

## 2020-12-04 DIAGNOSIS — D631 Anemia in chronic kidney disease: Secondary | ICD-10-CM | POA: Diagnosis not present

## 2020-12-04 DIAGNOSIS — E7800 Pure hypercholesterolemia, unspecified: Secondary | ICD-10-CM | POA: Diagnosis not present

## 2020-12-04 DIAGNOSIS — E1122 Type 2 diabetes mellitus with diabetic chronic kidney disease: Secondary | ICD-10-CM | POA: Diagnosis not present

## 2020-12-04 DIAGNOSIS — Z7984 Long term (current) use of oral hypoglycemic drugs: Secondary | ICD-10-CM | POA: Diagnosis not present

## 2020-12-04 DIAGNOSIS — Z87891 Personal history of nicotine dependence: Secondary | ICD-10-CM | POA: Diagnosis not present

## 2020-12-04 DIAGNOSIS — I10 Essential (primary) hypertension: Secondary | ICD-10-CM | POA: Diagnosis not present

## 2020-12-10 DIAGNOSIS — Z20822 Contact with and (suspected) exposure to covid-19: Secondary | ICD-10-CM | POA: Diagnosis not present

## 2020-12-10 DIAGNOSIS — L603 Nail dystrophy: Secondary | ICD-10-CM | POA: Diagnosis not present

## 2020-12-10 DIAGNOSIS — L03032 Cellulitis of left toe: Secondary | ICD-10-CM | POA: Diagnosis not present

## 2020-12-10 DIAGNOSIS — B351 Tinea unguium: Secondary | ICD-10-CM | POA: Diagnosis not present

## 2020-12-10 DIAGNOSIS — E1151 Type 2 diabetes mellitus with diabetic peripheral angiopathy without gangrene: Secondary | ICD-10-CM | POA: Diagnosis not present

## 2020-12-10 DIAGNOSIS — L602 Onychogryphosis: Secondary | ICD-10-CM | POA: Diagnosis not present

## 2020-12-30 DIAGNOSIS — N184 Chronic kidney disease, stage 4 (severe): Secondary | ICD-10-CM | POA: Diagnosis not present

## 2021-01-01 DIAGNOSIS — N184 Chronic kidney disease, stage 4 (severe): Secondary | ICD-10-CM | POA: Diagnosis not present

## 2021-01-01 DIAGNOSIS — D631 Anemia in chronic kidney disease: Secondary | ICD-10-CM | POA: Diagnosis not present

## 2021-01-26 DIAGNOSIS — D631 Anemia in chronic kidney disease: Secondary | ICD-10-CM | POA: Diagnosis not present

## 2021-01-26 DIAGNOSIS — N184 Chronic kidney disease, stage 4 (severe): Secondary | ICD-10-CM | POA: Diagnosis not present

## 2021-01-29 DIAGNOSIS — D631 Anemia in chronic kidney disease: Secondary | ICD-10-CM | POA: Diagnosis not present

## 2021-01-29 DIAGNOSIS — N184 Chronic kidney disease, stage 4 (severe): Secondary | ICD-10-CM | POA: Diagnosis not present

## 2021-02-18 DIAGNOSIS — L602 Onychogryphosis: Secondary | ICD-10-CM | POA: Diagnosis not present

## 2021-02-18 DIAGNOSIS — E1151 Type 2 diabetes mellitus with diabetic peripheral angiopathy without gangrene: Secondary | ICD-10-CM | POA: Diagnosis not present

## 2021-02-21 DIAGNOSIS — Z20822 Contact with and (suspected) exposure to covid-19: Secondary | ICD-10-CM | POA: Diagnosis not present

## 2021-02-24 DIAGNOSIS — D631 Anemia in chronic kidney disease: Secondary | ICD-10-CM | POA: Diagnosis not present

## 2021-02-24 DIAGNOSIS — N184 Chronic kidney disease, stage 4 (severe): Secondary | ICD-10-CM | POA: Diagnosis not present

## 2021-02-26 DIAGNOSIS — D631 Anemia in chronic kidney disease: Secondary | ICD-10-CM | POA: Diagnosis not present

## 2021-02-26 DIAGNOSIS — N184 Chronic kidney disease, stage 4 (severe): Secondary | ICD-10-CM | POA: Diagnosis not present

## 2021-03-05 DIAGNOSIS — N200 Calculus of kidney: Secondary | ICD-10-CM | POA: Diagnosis not present

## 2021-03-12 DIAGNOSIS — E1122 Type 2 diabetes mellitus with diabetic chronic kidney disease: Secondary | ICD-10-CM | POA: Diagnosis not present

## 2021-03-12 DIAGNOSIS — Z87891 Personal history of nicotine dependence: Secondary | ICD-10-CM | POA: Diagnosis not present

## 2021-03-12 DIAGNOSIS — N184 Chronic kidney disease, stage 4 (severe): Secondary | ICD-10-CM | POA: Diagnosis not present

## 2021-03-12 DIAGNOSIS — Z7984 Long term (current) use of oral hypoglycemic drugs: Secondary | ICD-10-CM | POA: Diagnosis not present

## 2021-03-12 DIAGNOSIS — D631 Anemia in chronic kidney disease: Secondary | ICD-10-CM | POA: Diagnosis not present

## 2021-03-12 DIAGNOSIS — Z7985 Long-term (current) use of injectable non-insulin antidiabetic drugs: Secondary | ICD-10-CM | POA: Diagnosis not present

## 2021-03-12 DIAGNOSIS — I129 Hypertensive chronic kidney disease with stage 1 through stage 4 chronic kidney disease, or unspecified chronic kidney disease: Secondary | ICD-10-CM | POA: Diagnosis not present

## 2021-03-24 DIAGNOSIS — N184 Chronic kidney disease, stage 4 (severe): Secondary | ICD-10-CM | POA: Diagnosis not present

## 2021-03-25 DIAGNOSIS — Z20822 Contact with and (suspected) exposure to covid-19: Secondary | ICD-10-CM | POA: Diagnosis not present

## 2021-03-26 DIAGNOSIS — N184 Chronic kidney disease, stage 4 (severe): Secondary | ICD-10-CM | POA: Diagnosis not present

## 2021-03-26 DIAGNOSIS — D631 Anemia in chronic kidney disease: Secondary | ICD-10-CM | POA: Diagnosis not present

## 2021-04-09 ENCOUNTER — Encounter (INDEPENDENT_AMBULATORY_CARE_PROVIDER_SITE_OTHER): Payer: Medicare Other | Admitting: Ophthalmology

## 2021-04-16 ENCOUNTER — Encounter (INDEPENDENT_AMBULATORY_CARE_PROVIDER_SITE_OTHER): Payer: Medicare Other | Admitting: Ophthalmology

## 2021-04-20 DIAGNOSIS — N184 Chronic kidney disease, stage 4 (severe): Secondary | ICD-10-CM | POA: Diagnosis not present

## 2021-04-20 DIAGNOSIS — D631 Anemia in chronic kidney disease: Secondary | ICD-10-CM | POA: Diagnosis not present

## 2021-04-22 DIAGNOSIS — Z20822 Contact with and (suspected) exposure to covid-19: Secondary | ICD-10-CM | POA: Diagnosis not present

## 2021-04-23 DIAGNOSIS — D631 Anemia in chronic kidney disease: Secondary | ICD-10-CM | POA: Diagnosis not present

## 2021-04-23 DIAGNOSIS — N184 Chronic kidney disease, stage 4 (severe): Secondary | ICD-10-CM | POA: Diagnosis not present

## 2021-04-28 DIAGNOSIS — B351 Tinea unguium: Secondary | ICD-10-CM | POA: Diagnosis not present

## 2021-04-28 DIAGNOSIS — E1151 Type 2 diabetes mellitus with diabetic peripheral angiopathy without gangrene: Secondary | ICD-10-CM | POA: Diagnosis not present

## 2021-04-28 DIAGNOSIS — L84 Corns and callosities: Secondary | ICD-10-CM | POA: Diagnosis not present

## 2021-05-14 ENCOUNTER — Encounter (INDEPENDENT_AMBULATORY_CARE_PROVIDER_SITE_OTHER): Payer: Medicare Other | Admitting: Ophthalmology

## 2021-05-14 DIAGNOSIS — E113393 Type 2 diabetes mellitus with moderate nonproliferative diabetic retinopathy without macular edema, bilateral: Secondary | ICD-10-CM | POA: Diagnosis not present

## 2021-05-14 DIAGNOSIS — H35371 Puckering of macula, right eye: Secondary | ICD-10-CM

## 2021-05-14 DIAGNOSIS — H353122 Nonexudative age-related macular degeneration, left eye, intermediate dry stage: Secondary | ICD-10-CM | POA: Diagnosis not present

## 2021-05-14 DIAGNOSIS — H43813 Vitreous degeneration, bilateral: Secondary | ICD-10-CM | POA: Diagnosis not present

## 2021-05-14 DIAGNOSIS — H33303 Unspecified retinal break, bilateral: Secondary | ICD-10-CM | POA: Diagnosis not present

## 2021-05-14 DIAGNOSIS — H35033 Hypertensive retinopathy, bilateral: Secondary | ICD-10-CM | POA: Diagnosis not present

## 2021-05-14 DIAGNOSIS — I10 Essential (primary) hypertension: Secondary | ICD-10-CM | POA: Diagnosis not present

## 2021-05-17 DIAGNOSIS — N184 Chronic kidney disease, stage 4 (severe): Secondary | ICD-10-CM | POA: Diagnosis not present

## 2021-05-17 DIAGNOSIS — D631 Anemia in chronic kidney disease: Secondary | ICD-10-CM | POA: Diagnosis not present

## 2021-05-21 DIAGNOSIS — N184 Chronic kidney disease, stage 4 (severe): Secondary | ICD-10-CM | POA: Diagnosis not present

## 2021-05-21 DIAGNOSIS — D631 Anemia in chronic kidney disease: Secondary | ICD-10-CM | POA: Diagnosis not present

## 2021-06-01 DIAGNOSIS — N184 Chronic kidney disease, stage 4 (severe): Secondary | ICD-10-CM | POA: Diagnosis not present

## 2021-06-01 DIAGNOSIS — Z0001 Encounter for general adult medical examination with abnormal findings: Secondary | ICD-10-CM | POA: Diagnosis not present

## 2021-06-01 DIAGNOSIS — E7800 Pure hypercholesterolemia, unspecified: Secondary | ICD-10-CM | POA: Diagnosis not present

## 2021-06-01 DIAGNOSIS — Z79899 Other long term (current) drug therapy: Secondary | ICD-10-CM | POA: Diagnosis not present

## 2021-06-01 DIAGNOSIS — E1121 Type 2 diabetes mellitus with diabetic nephropathy: Secondary | ICD-10-CM | POA: Diagnosis not present

## 2021-06-04 DIAGNOSIS — N184 Chronic kidney disease, stage 4 (severe): Secondary | ICD-10-CM | POA: Diagnosis not present

## 2021-06-04 DIAGNOSIS — I10 Essential (primary) hypertension: Secondary | ICD-10-CM | POA: Diagnosis not present

## 2021-06-04 DIAGNOSIS — Z79899 Other long term (current) drug therapy: Secondary | ICD-10-CM | POA: Diagnosis not present

## 2021-06-04 DIAGNOSIS — E1121 Type 2 diabetes mellitus with diabetic nephropathy: Secondary | ICD-10-CM | POA: Diagnosis not present

## 2021-06-15 DIAGNOSIS — N184 Chronic kidney disease, stage 4 (severe): Secondary | ICD-10-CM | POA: Diagnosis not present

## 2021-06-15 DIAGNOSIS — D631 Anemia in chronic kidney disease: Secondary | ICD-10-CM | POA: Diagnosis not present

## 2021-06-18 DIAGNOSIS — N184 Chronic kidney disease, stage 4 (severe): Secondary | ICD-10-CM | POA: Diagnosis not present

## 2021-06-18 DIAGNOSIS — D631 Anemia in chronic kidney disease: Secondary | ICD-10-CM | POA: Diagnosis not present

## 2021-06-28 DIAGNOSIS — E1151 Type 2 diabetes mellitus with diabetic peripheral angiopathy without gangrene: Secondary | ICD-10-CM | POA: Diagnosis not present

## 2021-06-28 DIAGNOSIS — B351 Tinea unguium: Secondary | ICD-10-CM | POA: Diagnosis not present

## 2021-06-28 DIAGNOSIS — L84 Corns and callosities: Secondary | ICD-10-CM | POA: Diagnosis not present

## 2021-07-12 DIAGNOSIS — D631 Anemia in chronic kidney disease: Secondary | ICD-10-CM | POA: Diagnosis not present

## 2021-07-12 DIAGNOSIS — N184 Chronic kidney disease, stage 4 (severe): Secondary | ICD-10-CM | POA: Diagnosis not present

## 2021-07-16 DIAGNOSIS — I129 Hypertensive chronic kidney disease with stage 1 through stage 4 chronic kidney disease, or unspecified chronic kidney disease: Secondary | ICD-10-CM | POA: Diagnosis not present

## 2021-07-16 DIAGNOSIS — N184 Chronic kidney disease, stage 4 (severe): Secondary | ICD-10-CM | POA: Diagnosis not present

## 2021-07-16 DIAGNOSIS — D631 Anemia in chronic kidney disease: Secondary | ICD-10-CM | POA: Diagnosis not present

## 2021-07-16 DIAGNOSIS — Z7984 Long term (current) use of oral hypoglycemic drugs: Secondary | ICD-10-CM | POA: Diagnosis not present

## 2021-07-16 DIAGNOSIS — Z87891 Personal history of nicotine dependence: Secondary | ICD-10-CM | POA: Diagnosis not present

## 2021-07-16 DIAGNOSIS — E1122 Type 2 diabetes mellitus with diabetic chronic kidney disease: Secondary | ICD-10-CM | POA: Diagnosis not present

## 2021-07-16 DIAGNOSIS — Z7985 Long-term (current) use of injectable non-insulin antidiabetic drugs: Secondary | ICD-10-CM | POA: Diagnosis not present

## 2021-08-11 DIAGNOSIS — N184 Chronic kidney disease, stage 4 (severe): Secondary | ICD-10-CM | POA: Diagnosis not present

## 2021-08-13 DIAGNOSIS — N184 Chronic kidney disease, stage 4 (severe): Secondary | ICD-10-CM | POA: Diagnosis not present

## 2021-08-13 DIAGNOSIS — D631 Anemia in chronic kidney disease: Secondary | ICD-10-CM | POA: Diagnosis not present

## 2021-09-08 DIAGNOSIS — D631 Anemia in chronic kidney disease: Secondary | ICD-10-CM | POA: Diagnosis not present

## 2021-09-08 DIAGNOSIS — N184 Chronic kidney disease, stage 4 (severe): Secondary | ICD-10-CM | POA: Diagnosis not present

## 2021-09-09 DIAGNOSIS — E1151 Type 2 diabetes mellitus with diabetic peripheral angiopathy without gangrene: Secondary | ICD-10-CM | POA: Diagnosis not present

## 2021-09-09 DIAGNOSIS — L84 Corns and callosities: Secondary | ICD-10-CM | POA: Diagnosis not present

## 2021-09-09 DIAGNOSIS — B351 Tinea unguium: Secondary | ICD-10-CM | POA: Diagnosis not present

## 2021-09-10 DIAGNOSIS — D631 Anemia in chronic kidney disease: Secondary | ICD-10-CM | POA: Diagnosis not present

## 2021-09-10 DIAGNOSIS — N184 Chronic kidney disease, stage 4 (severe): Secondary | ICD-10-CM | POA: Diagnosis not present

## 2021-09-24 DIAGNOSIS — Z23 Encounter for immunization: Secondary | ICD-10-CM | POA: Diagnosis not present

## 2021-10-05 DIAGNOSIS — D631 Anemia in chronic kidney disease: Secondary | ICD-10-CM | POA: Diagnosis not present

## 2021-10-05 DIAGNOSIS — N184 Chronic kidney disease, stage 4 (severe): Secondary | ICD-10-CM | POA: Diagnosis not present

## 2021-10-08 DIAGNOSIS — N184 Chronic kidney disease, stage 4 (severe): Secondary | ICD-10-CM | POA: Diagnosis not present

## 2021-10-08 DIAGNOSIS — D631 Anemia in chronic kidney disease: Secondary | ICD-10-CM | POA: Diagnosis not present

## 2021-11-03 DIAGNOSIS — D631 Anemia in chronic kidney disease: Secondary | ICD-10-CM | POA: Diagnosis not present

## 2021-11-03 DIAGNOSIS — N184 Chronic kidney disease, stage 4 (severe): Secondary | ICD-10-CM | POA: Diagnosis not present

## 2021-11-05 DIAGNOSIS — N184 Chronic kidney disease, stage 4 (severe): Secondary | ICD-10-CM | POA: Diagnosis not present

## 2021-11-05 DIAGNOSIS — D631 Anemia in chronic kidney disease: Secondary | ICD-10-CM | POA: Diagnosis not present

## 2021-11-17 DIAGNOSIS — L111 Transient acantholytic dermatosis [Grover]: Secondary | ICD-10-CM | POA: Diagnosis not present

## 2021-11-17 DIAGNOSIS — L853 Xerosis cutis: Secondary | ICD-10-CM | POA: Diagnosis not present

## 2021-11-23 DIAGNOSIS — B351 Tinea unguium: Secondary | ICD-10-CM | POA: Diagnosis not present

## 2021-11-23 DIAGNOSIS — E1151 Type 2 diabetes mellitus with diabetic peripheral angiopathy without gangrene: Secondary | ICD-10-CM | POA: Diagnosis not present

## 2021-11-29 DIAGNOSIS — N184 Chronic kidney disease, stage 4 (severe): Secondary | ICD-10-CM | POA: Diagnosis not present

## 2021-12-02 DIAGNOSIS — N184 Chronic kidney disease, stage 4 (severe): Secondary | ICD-10-CM | POA: Diagnosis not present

## 2021-12-02 DIAGNOSIS — D631 Anemia in chronic kidney disease: Secondary | ICD-10-CM | POA: Diagnosis not present

## 2021-12-02 DIAGNOSIS — E1122 Type 2 diabetes mellitus with diabetic chronic kidney disease: Secondary | ICD-10-CM | POA: Diagnosis not present

## 2021-12-02 DIAGNOSIS — I129 Hypertensive chronic kidney disease with stage 1 through stage 4 chronic kidney disease, or unspecified chronic kidney disease: Secondary | ICD-10-CM | POA: Diagnosis not present

## 2021-12-16 DIAGNOSIS — Z125 Encounter for screening for malignant neoplasm of prostate: Secondary | ICD-10-CM | POA: Diagnosis not present

## 2021-12-16 DIAGNOSIS — Z0000 Encounter for general adult medical examination without abnormal findings: Secondary | ICD-10-CM | POA: Diagnosis not present

## 2021-12-16 DIAGNOSIS — E1121 Type 2 diabetes mellitus with diabetic nephropathy: Secondary | ICD-10-CM | POA: Diagnosis not present

## 2021-12-16 DIAGNOSIS — N184 Chronic kidney disease, stage 4 (severe): Secondary | ICD-10-CM | POA: Diagnosis not present

## 2021-12-16 DIAGNOSIS — E1122 Type 2 diabetes mellitus with diabetic chronic kidney disease: Secondary | ICD-10-CM | POA: Diagnosis not present

## 2021-12-16 DIAGNOSIS — Z79899 Other long term (current) drug therapy: Secondary | ICD-10-CM | POA: Diagnosis not present

## 2021-12-16 DIAGNOSIS — E7800 Pure hypercholesterolemia, unspecified: Secondary | ICD-10-CM | POA: Diagnosis not present

## 2021-12-21 DIAGNOSIS — E7800 Pure hypercholesterolemia, unspecified: Secondary | ICD-10-CM | POA: Diagnosis not present

## 2021-12-21 DIAGNOSIS — D631 Anemia in chronic kidney disease: Secondary | ICD-10-CM | POA: Diagnosis not present

## 2021-12-21 DIAGNOSIS — Z1331 Encounter for screening for depression: Secondary | ICD-10-CM | POA: Diagnosis not present

## 2021-12-21 DIAGNOSIS — E1152 Type 2 diabetes mellitus with diabetic peripheral angiopathy with gangrene: Secondary | ICD-10-CM | POA: Diagnosis not present

## 2021-12-21 DIAGNOSIS — I10 Essential (primary) hypertension: Secondary | ICD-10-CM | POA: Diagnosis not present

## 2021-12-21 DIAGNOSIS — Z0001 Encounter for general adult medical examination with abnormal findings: Secondary | ICD-10-CM | POA: Diagnosis not present

## 2021-12-21 DIAGNOSIS — N184 Chronic kidney disease, stage 4 (severe): Secondary | ICD-10-CM | POA: Diagnosis not present

## 2021-12-29 DIAGNOSIS — N184 Chronic kidney disease, stage 4 (severe): Secondary | ICD-10-CM | POA: Diagnosis not present

## 2021-12-31 DIAGNOSIS — N184 Chronic kidney disease, stage 4 (severe): Secondary | ICD-10-CM | POA: Diagnosis not present

## 2021-12-31 DIAGNOSIS — D631 Anemia in chronic kidney disease: Secondary | ICD-10-CM | POA: Diagnosis not present

## 2022-01-05 DIAGNOSIS — L111 Transient acantholytic dermatosis [Grover]: Secondary | ICD-10-CM | POA: Diagnosis not present

## 2022-01-05 DIAGNOSIS — L2089 Other atopic dermatitis: Secondary | ICD-10-CM | POA: Diagnosis not present

## 2022-01-27 DIAGNOSIS — B351 Tinea unguium: Secondary | ICD-10-CM | POA: Diagnosis not present

## 2022-01-27 DIAGNOSIS — E1151 Type 2 diabetes mellitus with diabetic peripheral angiopathy without gangrene: Secondary | ICD-10-CM | POA: Diagnosis not present

## 2022-01-28 DIAGNOSIS — N184 Chronic kidney disease, stage 4 (severe): Secondary | ICD-10-CM | POA: Diagnosis not present

## 2022-01-28 DIAGNOSIS — D631 Anemia in chronic kidney disease: Secondary | ICD-10-CM | POA: Diagnosis not present

## 2022-02-10 DIAGNOSIS — D485 Neoplasm of uncertain behavior of skin: Secondary | ICD-10-CM | POA: Diagnosis not present

## 2022-02-10 DIAGNOSIS — L120 Bullous pemphigoid: Secondary | ICD-10-CM | POA: Diagnosis not present

## 2022-02-10 DIAGNOSIS — L309 Dermatitis, unspecified: Secondary | ICD-10-CM | POA: Diagnosis not present

## 2022-02-15 DIAGNOSIS — L120 Bullous pemphigoid: Secondary | ICD-10-CM | POA: Diagnosis not present

## 2022-02-23 DIAGNOSIS — N184 Chronic kidney disease, stage 4 (severe): Secondary | ICD-10-CM | POA: Diagnosis not present

## 2022-02-23 DIAGNOSIS — D631 Anemia in chronic kidney disease: Secondary | ICD-10-CM | POA: Diagnosis not present

## 2022-02-25 DIAGNOSIS — D631 Anemia in chronic kidney disease: Secondary | ICD-10-CM | POA: Diagnosis not present

## 2022-02-25 DIAGNOSIS — N184 Chronic kidney disease, stage 4 (severe): Secondary | ICD-10-CM | POA: Diagnosis not present

## 2022-03-01 DIAGNOSIS — L120 Bullous pemphigoid: Secondary | ICD-10-CM | POA: Diagnosis not present

## 2022-03-22 DIAGNOSIS — D631 Anemia in chronic kidney disease: Secondary | ICD-10-CM | POA: Diagnosis not present

## 2022-03-22 DIAGNOSIS — N184 Chronic kidney disease, stage 4 (severe): Secondary | ICD-10-CM | POA: Diagnosis not present

## 2022-03-25 DIAGNOSIS — D631 Anemia in chronic kidney disease: Secondary | ICD-10-CM | POA: Diagnosis not present

## 2022-03-25 DIAGNOSIS — N184 Chronic kidney disease, stage 4 (severe): Secondary | ICD-10-CM | POA: Diagnosis not present

## 2022-03-25 DIAGNOSIS — L120 Bullous pemphigoid: Secondary | ICD-10-CM | POA: Diagnosis not present

## 2022-03-31 DIAGNOSIS — E1151 Type 2 diabetes mellitus with diabetic peripheral angiopathy without gangrene: Secondary | ICD-10-CM | POA: Diagnosis not present

## 2022-03-31 DIAGNOSIS — B351 Tinea unguium: Secondary | ICD-10-CM | POA: Diagnosis not present

## 2022-03-31 DIAGNOSIS — L84 Corns and callosities: Secondary | ICD-10-CM | POA: Diagnosis not present

## 2022-04-20 DIAGNOSIS — N184 Chronic kidney disease, stage 4 (severe): Secondary | ICD-10-CM | POA: Diagnosis not present

## 2022-04-25 DIAGNOSIS — D631 Anemia in chronic kidney disease: Secondary | ICD-10-CM | POA: Diagnosis not present

## 2022-04-25 DIAGNOSIS — N184 Chronic kidney disease, stage 4 (severe): Secondary | ICD-10-CM | POA: Diagnosis not present

## 2022-04-25 DIAGNOSIS — E1122 Type 2 diabetes mellitus with diabetic chronic kidney disease: Secondary | ICD-10-CM | POA: Diagnosis not present

## 2022-04-25 DIAGNOSIS — Z7985 Long-term (current) use of injectable non-insulin antidiabetic drugs: Secondary | ICD-10-CM | POA: Diagnosis not present

## 2022-04-25 DIAGNOSIS — Z87891 Personal history of nicotine dependence: Secondary | ICD-10-CM | POA: Diagnosis not present

## 2022-04-25 DIAGNOSIS — I129 Hypertensive chronic kidney disease with stage 1 through stage 4 chronic kidney disease, or unspecified chronic kidney disease: Secondary | ICD-10-CM | POA: Diagnosis not present

## 2022-04-25 DIAGNOSIS — Z7984 Long term (current) use of oral hypoglycemic drugs: Secondary | ICD-10-CM | POA: Diagnosis not present

## 2022-04-26 DIAGNOSIS — E1152 Type 2 diabetes mellitus with diabetic peripheral angiopathy with gangrene: Secondary | ICD-10-CM | POA: Diagnosis not present

## 2022-04-27 DIAGNOSIS — L120 Bullous pemphigoid: Secondary | ICD-10-CM | POA: Diagnosis not present

## 2022-05-02 DIAGNOSIS — E1151 Type 2 diabetes mellitus with diabetic peripheral angiopathy without gangrene: Secondary | ICD-10-CM | POA: Diagnosis not present

## 2022-05-02 DIAGNOSIS — L97911 Non-pressure chronic ulcer of unspecified part of right lower leg limited to breakdown of skin: Secondary | ICD-10-CM | POA: Diagnosis not present

## 2022-05-02 DIAGNOSIS — R238 Other skin changes: Secondary | ICD-10-CM | POA: Diagnosis not present

## 2022-05-13 ENCOUNTER — Encounter (INDEPENDENT_AMBULATORY_CARE_PROVIDER_SITE_OTHER): Payer: Medicare Other | Admitting: Ophthalmology

## 2022-05-16 DIAGNOSIS — E1151 Type 2 diabetes mellitus with diabetic peripheral angiopathy without gangrene: Secondary | ICD-10-CM | POA: Diagnosis not present

## 2022-05-16 DIAGNOSIS — L97911 Non-pressure chronic ulcer of unspecified part of right lower leg limited to breakdown of skin: Secondary | ICD-10-CM | POA: Diagnosis not present

## 2022-05-16 DIAGNOSIS — R238 Other skin changes: Secondary | ICD-10-CM | POA: Diagnosis not present

## 2022-05-20 ENCOUNTER — Encounter (INDEPENDENT_AMBULATORY_CARE_PROVIDER_SITE_OTHER): Payer: Medicare Other | Admitting: Ophthalmology

## 2022-05-20 DIAGNOSIS — H43813 Vitreous degeneration, bilateral: Secondary | ICD-10-CM | POA: Diagnosis not present

## 2022-05-20 DIAGNOSIS — H353122 Nonexudative age-related macular degeneration, left eye, intermediate dry stage: Secondary | ICD-10-CM | POA: Diagnosis not present

## 2022-05-20 DIAGNOSIS — H353111 Nonexudative age-related macular degeneration, right eye, early dry stage: Secondary | ICD-10-CM

## 2022-05-20 DIAGNOSIS — H35033 Hypertensive retinopathy, bilateral: Secondary | ICD-10-CM

## 2022-05-20 DIAGNOSIS — H35371 Puckering of macula, right eye: Secondary | ICD-10-CM

## 2022-05-20 DIAGNOSIS — H33303 Unspecified retinal break, bilateral: Secondary | ICD-10-CM

## 2022-05-20 DIAGNOSIS — I10 Essential (primary) hypertension: Secondary | ICD-10-CM

## 2022-05-23 DIAGNOSIS — I872 Venous insufficiency (chronic) (peripheral): Secondary | ICD-10-CM | POA: Diagnosis not present

## 2022-05-23 DIAGNOSIS — L97512 Non-pressure chronic ulcer of other part of right foot with fat layer exposed: Secondary | ICD-10-CM | POA: Diagnosis not present

## 2022-05-23 DIAGNOSIS — I83009 Varicose veins of unspecified lower extremity with ulcer of unspecified site: Secondary | ICD-10-CM | POA: Diagnosis not present

## 2022-05-23 DIAGNOSIS — E1151 Type 2 diabetes mellitus with diabetic peripheral angiopathy without gangrene: Secondary | ICD-10-CM | POA: Diagnosis not present

## 2022-05-23 DIAGNOSIS — R238 Other skin changes: Secondary | ICD-10-CM | POA: Diagnosis not present

## 2022-05-23 DIAGNOSIS — L97912 Non-pressure chronic ulcer of unspecified part of right lower leg with fat layer exposed: Secondary | ICD-10-CM | POA: Diagnosis not present

## 2022-05-23 DIAGNOSIS — L97909 Non-pressure chronic ulcer of unspecified part of unspecified lower leg with unspecified severity: Secondary | ICD-10-CM | POA: Diagnosis not present

## 2022-05-24 DIAGNOSIS — N184 Chronic kidney disease, stage 4 (severe): Secondary | ICD-10-CM | POA: Diagnosis not present

## 2022-05-24 DIAGNOSIS — D631 Anemia in chronic kidney disease: Secondary | ICD-10-CM | POA: Diagnosis not present

## 2022-05-27 DIAGNOSIS — R238 Other skin changes: Secondary | ICD-10-CM | POA: Diagnosis not present

## 2022-05-27 DIAGNOSIS — L97911 Non-pressure chronic ulcer of unspecified part of right lower leg limited to breakdown of skin: Secondary | ICD-10-CM | POA: Diagnosis not present

## 2022-05-27 DIAGNOSIS — D631 Anemia in chronic kidney disease: Secondary | ICD-10-CM | POA: Diagnosis not present

## 2022-05-27 DIAGNOSIS — I872 Venous insufficiency (chronic) (peripheral): Secondary | ICD-10-CM | POA: Diagnosis not present

## 2022-05-27 DIAGNOSIS — E1151 Type 2 diabetes mellitus with diabetic peripheral angiopathy without gangrene: Secondary | ICD-10-CM | POA: Diagnosis not present

## 2022-05-27 DIAGNOSIS — N184 Chronic kidney disease, stage 4 (severe): Secondary | ICD-10-CM | POA: Diagnosis not present

## 2022-05-27 DIAGNOSIS — L97912 Non-pressure chronic ulcer of unspecified part of right lower leg with fat layer exposed: Secondary | ICD-10-CM | POA: Diagnosis not present

## 2022-05-31 DIAGNOSIS — L97911 Non-pressure chronic ulcer of unspecified part of right lower leg limited to breakdown of skin: Secondary | ICD-10-CM | POA: Diagnosis not present

## 2022-05-31 DIAGNOSIS — I83012 Varicose veins of right lower extremity with ulcer of calf: Secondary | ICD-10-CM | POA: Diagnosis not present

## 2022-05-31 DIAGNOSIS — E1151 Type 2 diabetes mellitus with diabetic peripheral angiopathy without gangrene: Secondary | ICD-10-CM | POA: Diagnosis not present

## 2022-05-31 DIAGNOSIS — L97512 Non-pressure chronic ulcer of other part of right foot with fat layer exposed: Secondary | ICD-10-CM | POA: Diagnosis not present

## 2022-05-31 DIAGNOSIS — R238 Other skin changes: Secondary | ICD-10-CM | POA: Diagnosis not present

## 2022-05-31 DIAGNOSIS — L97212 Non-pressure chronic ulcer of right calf with fat layer exposed: Secondary | ICD-10-CM | POA: Diagnosis not present

## 2022-06-03 DIAGNOSIS — I83012 Varicose veins of right lower extremity with ulcer of calf: Secondary | ICD-10-CM | POA: Diagnosis not present

## 2022-06-03 DIAGNOSIS — L97212 Non-pressure chronic ulcer of right calf with fat layer exposed: Secondary | ICD-10-CM | POA: Diagnosis not present

## 2022-06-03 DIAGNOSIS — E1151 Type 2 diabetes mellitus with diabetic peripheral angiopathy without gangrene: Secondary | ICD-10-CM | POA: Diagnosis not present

## 2022-06-03 DIAGNOSIS — R238 Other skin changes: Secondary | ICD-10-CM | POA: Diagnosis not present

## 2022-06-03 DIAGNOSIS — E1142 Type 2 diabetes mellitus with diabetic polyneuropathy: Secondary | ICD-10-CM | POA: Diagnosis not present

## 2022-06-24 DIAGNOSIS — D631 Anemia in chronic kidney disease: Secondary | ICD-10-CM | POA: Diagnosis not present

## 2022-06-24 DIAGNOSIS — N184 Chronic kidney disease, stage 4 (severe): Secondary | ICD-10-CM | POA: Diagnosis not present

## 2022-06-28 DIAGNOSIS — R238 Other skin changes: Secondary | ICD-10-CM | POA: Diagnosis not present

## 2022-06-28 DIAGNOSIS — E1151 Type 2 diabetes mellitus with diabetic peripheral angiopathy without gangrene: Secondary | ICD-10-CM | POA: Diagnosis not present

## 2022-06-28 DIAGNOSIS — D631 Anemia in chronic kidney disease: Secondary | ICD-10-CM | POA: Diagnosis not present

## 2022-06-28 DIAGNOSIS — I83012 Varicose veins of right lower extremity with ulcer of calf: Secondary | ICD-10-CM | POA: Diagnosis not present

## 2022-06-28 DIAGNOSIS — L97212 Non-pressure chronic ulcer of right calf with fat layer exposed: Secondary | ICD-10-CM | POA: Diagnosis not present

## 2022-06-28 DIAGNOSIS — E1142 Type 2 diabetes mellitus with diabetic polyneuropathy: Secondary | ICD-10-CM | POA: Diagnosis not present

## 2022-06-28 DIAGNOSIS — N184 Chronic kidney disease, stage 4 (severe): Secondary | ICD-10-CM | POA: Diagnosis not present

## 2022-07-05 DIAGNOSIS — I83012 Varicose veins of right lower extremity with ulcer of calf: Secondary | ICD-10-CM | POA: Diagnosis not present

## 2022-07-05 DIAGNOSIS — E1151 Type 2 diabetes mellitus with diabetic peripheral angiopathy without gangrene: Secondary | ICD-10-CM | POA: Diagnosis not present

## 2022-07-05 DIAGNOSIS — E1142 Type 2 diabetes mellitus with diabetic polyneuropathy: Secondary | ICD-10-CM | POA: Diagnosis not present

## 2022-07-05 DIAGNOSIS — R238 Other skin changes: Secondary | ICD-10-CM | POA: Diagnosis not present

## 2022-07-05 DIAGNOSIS — L97212 Non-pressure chronic ulcer of right calf with fat layer exposed: Secondary | ICD-10-CM | POA: Diagnosis not present

## 2022-07-13 DIAGNOSIS — E7800 Pure hypercholesterolemia, unspecified: Secondary | ICD-10-CM | POA: Diagnosis not present

## 2022-07-13 DIAGNOSIS — E1152 Type 2 diabetes mellitus with diabetic peripheral angiopathy with gangrene: Secondary | ICD-10-CM | POA: Diagnosis not present

## 2022-07-13 DIAGNOSIS — E1121 Type 2 diabetes mellitus with diabetic nephropathy: Secondary | ICD-10-CM | POA: Diagnosis not present

## 2022-07-13 DIAGNOSIS — Z79899 Other long term (current) drug therapy: Secondary | ICD-10-CM | POA: Diagnosis not present

## 2022-07-13 DIAGNOSIS — N182 Chronic kidney disease, stage 2 (mild): Secondary | ICD-10-CM | POA: Diagnosis not present

## 2022-07-18 DIAGNOSIS — E7800 Pure hypercholesterolemia, unspecified: Secondary | ICD-10-CM | POA: Diagnosis not present

## 2022-07-18 DIAGNOSIS — D631 Anemia in chronic kidney disease: Secondary | ICD-10-CM | POA: Diagnosis not present

## 2022-07-18 DIAGNOSIS — L120 Bullous pemphigoid: Secondary | ICD-10-CM | POA: Diagnosis not present

## 2022-07-18 DIAGNOSIS — E1121 Type 2 diabetes mellitus with diabetic nephropathy: Secondary | ICD-10-CM | POA: Diagnosis not present

## 2022-07-18 DIAGNOSIS — I10 Essential (primary) hypertension: Secondary | ICD-10-CM | POA: Diagnosis not present

## 2022-07-18 DIAGNOSIS — N184 Chronic kidney disease, stage 4 (severe): Secondary | ICD-10-CM | POA: Diagnosis not present

## 2022-07-22 DIAGNOSIS — N184 Chronic kidney disease, stage 4 (severe): Secondary | ICD-10-CM | POA: Diagnosis not present

## 2022-07-22 DIAGNOSIS — D631 Anemia in chronic kidney disease: Secondary | ICD-10-CM | POA: Diagnosis not present

## 2022-07-26 DIAGNOSIS — N184 Chronic kidney disease, stage 4 (severe): Secondary | ICD-10-CM | POA: Diagnosis not present

## 2022-07-26 DIAGNOSIS — D631 Anemia in chronic kidney disease: Secondary | ICD-10-CM | POA: Diagnosis not present

## 2022-08-03 DIAGNOSIS — L97212 Non-pressure chronic ulcer of right calf with fat layer exposed: Secondary | ICD-10-CM | POA: Diagnosis not present

## 2022-08-03 DIAGNOSIS — E1151 Type 2 diabetes mellitus with diabetic peripheral angiopathy without gangrene: Secondary | ICD-10-CM | POA: Diagnosis not present

## 2022-08-03 DIAGNOSIS — E1142 Type 2 diabetes mellitus with diabetic polyneuropathy: Secondary | ICD-10-CM | POA: Diagnosis not present

## 2022-08-03 DIAGNOSIS — I83012 Varicose veins of right lower extremity with ulcer of calf: Secondary | ICD-10-CM | POA: Diagnosis not present

## 2022-08-03 DIAGNOSIS — R238 Other skin changes: Secondary | ICD-10-CM | POA: Diagnosis not present

## 2022-08-19 DIAGNOSIS — N184 Chronic kidney disease, stage 4 (severe): Secondary | ICD-10-CM | POA: Diagnosis not present

## 2022-08-23 DIAGNOSIS — D631 Anemia in chronic kidney disease: Secondary | ICD-10-CM | POA: Diagnosis not present

## 2022-08-23 DIAGNOSIS — N184 Chronic kidney disease, stage 4 (severe): Secondary | ICD-10-CM | POA: Diagnosis not present

## 2022-09-02 DIAGNOSIS — Z23 Encounter for immunization: Secondary | ICD-10-CM | POA: Diagnosis not present

## 2022-09-08 DIAGNOSIS — I129 Hypertensive chronic kidney disease with stage 1 through stage 4 chronic kidney disease, or unspecified chronic kidney disease: Secondary | ICD-10-CM | POA: Diagnosis not present

## 2022-09-08 DIAGNOSIS — N184 Chronic kidney disease, stage 4 (severe): Secondary | ICD-10-CM | POA: Diagnosis not present

## 2022-09-08 DIAGNOSIS — E1122 Type 2 diabetes mellitus with diabetic chronic kidney disease: Secondary | ICD-10-CM | POA: Diagnosis not present

## 2022-09-08 DIAGNOSIS — D631 Anemia in chronic kidney disease: Secondary | ICD-10-CM | POA: Diagnosis not present

## 2022-09-14 DIAGNOSIS — L120 Bullous pemphigoid: Secondary | ICD-10-CM | POA: Diagnosis not present

## 2022-09-14 DIAGNOSIS — L560 Drug phototoxic response: Secondary | ICD-10-CM | POA: Diagnosis not present

## 2022-09-14 DIAGNOSIS — L603 Nail dystrophy: Secondary | ICD-10-CM | POA: Diagnosis not present

## 2022-09-19 DIAGNOSIS — D631 Anemia in chronic kidney disease: Secondary | ICD-10-CM | POA: Diagnosis not present

## 2022-09-19 DIAGNOSIS — N184 Chronic kidney disease, stage 4 (severe): Secondary | ICD-10-CM | POA: Diagnosis not present

## 2022-09-20 DIAGNOSIS — D631 Anemia in chronic kidney disease: Secondary | ICD-10-CM | POA: Diagnosis not present

## 2022-09-20 DIAGNOSIS — N184 Chronic kidney disease, stage 4 (severe): Secondary | ICD-10-CM | POA: Diagnosis not present

## 2022-10-12 DIAGNOSIS — I83012 Varicose veins of right lower extremity with ulcer of calf: Secondary | ICD-10-CM | POA: Diagnosis not present

## 2022-10-12 DIAGNOSIS — E1142 Type 2 diabetes mellitus with diabetic polyneuropathy: Secondary | ICD-10-CM | POA: Diagnosis not present

## 2022-10-12 DIAGNOSIS — L84 Corns and callosities: Secondary | ICD-10-CM | POA: Diagnosis not present

## 2022-10-12 DIAGNOSIS — L603 Nail dystrophy: Secondary | ICD-10-CM | POA: Diagnosis not present

## 2022-10-12 DIAGNOSIS — L97212 Non-pressure chronic ulcer of right calf with fat layer exposed: Secondary | ICD-10-CM | POA: Diagnosis not present

## 2022-10-12 DIAGNOSIS — E1151 Type 2 diabetes mellitus with diabetic peripheral angiopathy without gangrene: Secondary | ICD-10-CM | POA: Diagnosis not present

## 2022-10-14 DIAGNOSIS — N184 Chronic kidney disease, stage 4 (severe): Secondary | ICD-10-CM | POA: Diagnosis not present

## 2022-10-14 DIAGNOSIS — D631 Anemia in chronic kidney disease: Secondary | ICD-10-CM | POA: Diagnosis not present

## 2022-10-18 DIAGNOSIS — N184 Chronic kidney disease, stage 4 (severe): Secondary | ICD-10-CM | POA: Diagnosis not present

## 2022-10-18 DIAGNOSIS — D631 Anemia in chronic kidney disease: Secondary | ICD-10-CM | POA: Diagnosis not present

## 2022-11-14 DIAGNOSIS — N184 Chronic kidney disease, stage 4 (severe): Secondary | ICD-10-CM | POA: Diagnosis not present

## 2022-11-14 DIAGNOSIS — D631 Anemia in chronic kidney disease: Secondary | ICD-10-CM | POA: Diagnosis not present

## 2022-11-15 DIAGNOSIS — D631 Anemia in chronic kidney disease: Secondary | ICD-10-CM | POA: Diagnosis not present

## 2022-11-15 DIAGNOSIS — N184 Chronic kidney disease, stage 4 (severe): Secondary | ICD-10-CM | POA: Diagnosis not present

## 2022-11-25 DIAGNOSIS — Z23 Encounter for immunization: Secondary | ICD-10-CM | POA: Diagnosis not present

## 2022-12-09 DIAGNOSIS — D631 Anemia in chronic kidney disease: Secondary | ICD-10-CM | POA: Diagnosis not present

## 2022-12-09 DIAGNOSIS — N184 Chronic kidney disease, stage 4 (severe): Secondary | ICD-10-CM | POA: Diagnosis not present

## 2022-12-13 DIAGNOSIS — D631 Anemia in chronic kidney disease: Secondary | ICD-10-CM | POA: Diagnosis not present

## 2022-12-13 DIAGNOSIS — N184 Chronic kidney disease, stage 4 (severe): Secondary | ICD-10-CM | POA: Diagnosis not present

## 2022-12-14 DIAGNOSIS — L84 Corns and callosities: Secondary | ICD-10-CM | POA: Diagnosis not present

## 2022-12-14 DIAGNOSIS — I83012 Varicose veins of right lower extremity with ulcer of calf: Secondary | ICD-10-CM | POA: Diagnosis not present

## 2022-12-14 DIAGNOSIS — L97212 Non-pressure chronic ulcer of right calf with fat layer exposed: Secondary | ICD-10-CM | POA: Diagnosis not present

## 2022-12-14 DIAGNOSIS — G629 Polyneuropathy, unspecified: Secondary | ICD-10-CM | POA: Diagnosis not present

## 2022-12-14 DIAGNOSIS — L603 Nail dystrophy: Secondary | ICD-10-CM | POA: Diagnosis not present

## 2022-12-14 DIAGNOSIS — E1151 Type 2 diabetes mellitus with diabetic peripheral angiopathy without gangrene: Secondary | ICD-10-CM | POA: Diagnosis not present

## 2022-12-20 DIAGNOSIS — N200 Calculus of kidney: Secondary | ICD-10-CM | POA: Diagnosis not present

## 2022-12-21 DIAGNOSIS — L84 Corns and callosities: Secondary | ICD-10-CM | POA: Diagnosis not present

## 2022-12-21 DIAGNOSIS — L97212 Non-pressure chronic ulcer of right calf with fat layer exposed: Secondary | ICD-10-CM | POA: Diagnosis not present

## 2022-12-21 DIAGNOSIS — M19072 Primary osteoarthritis, left ankle and foot: Secondary | ICD-10-CM | POA: Diagnosis not present

## 2022-12-21 DIAGNOSIS — G629 Polyneuropathy, unspecified: Secondary | ICD-10-CM | POA: Diagnosis not present

## 2022-12-21 DIAGNOSIS — L603 Nail dystrophy: Secondary | ICD-10-CM | POA: Diagnosis not present

## 2022-12-21 DIAGNOSIS — M19071 Primary osteoarthritis, right ankle and foot: Secondary | ICD-10-CM | POA: Diagnosis not present

## 2022-12-21 DIAGNOSIS — I83012 Varicose veins of right lower extremity with ulcer of calf: Secondary | ICD-10-CM | POA: Diagnosis not present

## 2022-12-21 DIAGNOSIS — E1151 Type 2 diabetes mellitus with diabetic peripheral angiopathy without gangrene: Secondary | ICD-10-CM | POA: Diagnosis not present

## 2023-01-05 DIAGNOSIS — L570 Actinic keratosis: Secondary | ICD-10-CM | POA: Diagnosis not present

## 2023-01-05 DIAGNOSIS — L120 Bullous pemphigoid: Secondary | ICD-10-CM | POA: Diagnosis not present

## 2023-01-05 DIAGNOSIS — N184 Chronic kidney disease, stage 4 (severe): Secondary | ICD-10-CM | POA: Diagnosis not present

## 2023-01-05 DIAGNOSIS — D225 Melanocytic nevi of trunk: Secondary | ICD-10-CM | POA: Diagnosis not present

## 2023-01-05 DIAGNOSIS — L814 Other melanin hyperpigmentation: Secondary | ICD-10-CM | POA: Diagnosis not present

## 2023-01-05 DIAGNOSIS — D1801 Hemangioma of skin and subcutaneous tissue: Secondary | ICD-10-CM | POA: Diagnosis not present

## 2023-01-10 DIAGNOSIS — D631 Anemia in chronic kidney disease: Secondary | ICD-10-CM | POA: Diagnosis not present

## 2023-01-10 DIAGNOSIS — E1122 Type 2 diabetes mellitus with diabetic chronic kidney disease: Secondary | ICD-10-CM | POA: Diagnosis not present

## 2023-01-10 DIAGNOSIS — Z7985 Long-term (current) use of injectable non-insulin antidiabetic drugs: Secondary | ICD-10-CM | POA: Diagnosis not present

## 2023-01-10 DIAGNOSIS — Z7984 Long term (current) use of oral hypoglycemic drugs: Secondary | ICD-10-CM | POA: Diagnosis not present

## 2023-01-10 DIAGNOSIS — I129 Hypertensive chronic kidney disease with stage 1 through stage 4 chronic kidney disease, or unspecified chronic kidney disease: Secondary | ICD-10-CM | POA: Diagnosis not present

## 2023-01-10 DIAGNOSIS — Z87891 Personal history of nicotine dependence: Secondary | ICD-10-CM | POA: Diagnosis not present

## 2023-01-10 DIAGNOSIS — N184 Chronic kidney disease, stage 4 (severe): Secondary | ICD-10-CM | POA: Diagnosis not present

## 2023-01-11 DIAGNOSIS — L603 Nail dystrophy: Secondary | ICD-10-CM | POA: Diagnosis not present

## 2023-01-11 DIAGNOSIS — M19071 Primary osteoarthritis, right ankle and foot: Secondary | ICD-10-CM | POA: Diagnosis not present

## 2023-01-11 DIAGNOSIS — G629 Polyneuropathy, unspecified: Secondary | ICD-10-CM | POA: Diagnosis not present

## 2023-01-11 DIAGNOSIS — L84 Corns and callosities: Secondary | ICD-10-CM | POA: Diagnosis not present

## 2023-01-11 DIAGNOSIS — M19072 Primary osteoarthritis, left ankle and foot: Secondary | ICD-10-CM | POA: Diagnosis not present

## 2023-01-11 DIAGNOSIS — E1151 Type 2 diabetes mellitus with diabetic peripheral angiopathy without gangrene: Secondary | ICD-10-CM | POA: Diagnosis not present

## 2023-01-11 DIAGNOSIS — L97212 Non-pressure chronic ulcer of right calf with fat layer exposed: Secondary | ICD-10-CM | POA: Diagnosis not present

## 2023-01-11 DIAGNOSIS — I83012 Varicose veins of right lower extremity with ulcer of calf: Secondary | ICD-10-CM | POA: Diagnosis not present

## 2023-01-31 DIAGNOSIS — B351 Tinea unguium: Secondary | ICD-10-CM | POA: Diagnosis not present

## 2023-01-31 DIAGNOSIS — G629 Polyneuropathy, unspecified: Secondary | ICD-10-CM | POA: Diagnosis not present

## 2023-01-31 DIAGNOSIS — L84 Corns and callosities: Secondary | ICD-10-CM | POA: Diagnosis not present

## 2023-01-31 DIAGNOSIS — L97212 Non-pressure chronic ulcer of right calf with fat layer exposed: Secondary | ICD-10-CM | POA: Diagnosis not present

## 2023-01-31 DIAGNOSIS — E1142 Type 2 diabetes mellitus with diabetic polyneuropathy: Secondary | ICD-10-CM | POA: Diagnosis not present

## 2023-01-31 DIAGNOSIS — M19072 Primary osteoarthritis, left ankle and foot: Secondary | ICD-10-CM | POA: Diagnosis not present

## 2023-01-31 DIAGNOSIS — L603 Nail dystrophy: Secondary | ICD-10-CM | POA: Diagnosis not present

## 2023-01-31 DIAGNOSIS — L851 Acquired keratosis [keratoderma] palmaris et plantaris: Secondary | ICD-10-CM | POA: Diagnosis not present

## 2023-01-31 DIAGNOSIS — I83012 Varicose veins of right lower extremity with ulcer of calf: Secondary | ICD-10-CM | POA: Diagnosis not present

## 2023-01-31 DIAGNOSIS — E1151 Type 2 diabetes mellitus with diabetic peripheral angiopathy without gangrene: Secondary | ICD-10-CM | POA: Diagnosis not present

## 2023-01-31 DIAGNOSIS — M19071 Primary osteoarthritis, right ankle and foot: Secondary | ICD-10-CM | POA: Diagnosis not present

## 2023-02-03 DIAGNOSIS — N184 Chronic kidney disease, stage 4 (severe): Secondary | ICD-10-CM | POA: Diagnosis not present

## 2023-02-03 DIAGNOSIS — D631 Anemia in chronic kidney disease: Secondary | ICD-10-CM | POA: Diagnosis not present

## 2023-02-07 DIAGNOSIS — D631 Anemia in chronic kidney disease: Secondary | ICD-10-CM | POA: Diagnosis not present

## 2023-02-07 DIAGNOSIS — N184 Chronic kidney disease, stage 4 (severe): Secondary | ICD-10-CM | POA: Diagnosis not present

## 2023-03-03 DIAGNOSIS — N184 Chronic kidney disease, stage 4 (severe): Secondary | ICD-10-CM | POA: Diagnosis not present

## 2023-03-03 DIAGNOSIS — D631 Anemia in chronic kidney disease: Secondary | ICD-10-CM | POA: Diagnosis not present

## 2023-03-07 DIAGNOSIS — D631 Anemia in chronic kidney disease: Secondary | ICD-10-CM | POA: Diagnosis not present

## 2023-03-07 DIAGNOSIS — N184 Chronic kidney disease, stage 4 (severe): Secondary | ICD-10-CM | POA: Diagnosis not present

## 2023-03-17 DIAGNOSIS — Z79899 Other long term (current) drug therapy: Secondary | ICD-10-CM | POA: Diagnosis not present

## 2023-03-17 DIAGNOSIS — N184 Chronic kidney disease, stage 4 (severe): Secondary | ICD-10-CM | POA: Diagnosis not present

## 2023-03-17 DIAGNOSIS — E1121 Type 2 diabetes mellitus with diabetic nephropathy: Secondary | ICD-10-CM | POA: Diagnosis not present

## 2023-03-17 DIAGNOSIS — E1152 Type 2 diabetes mellitus with diabetic peripheral angiopathy with gangrene: Secondary | ICD-10-CM | POA: Diagnosis not present

## 2023-03-17 DIAGNOSIS — E7800 Pure hypercholesterolemia, unspecified: Secondary | ICD-10-CM | POA: Diagnosis not present

## 2023-03-21 DIAGNOSIS — D631 Anemia in chronic kidney disease: Secondary | ICD-10-CM | POA: Diagnosis not present

## 2023-03-21 DIAGNOSIS — N184 Chronic kidney disease, stage 4 (severe): Secondary | ICD-10-CM | POA: Diagnosis not present

## 2023-03-21 DIAGNOSIS — E7800 Pure hypercholesterolemia, unspecified: Secondary | ICD-10-CM | POA: Diagnosis not present

## 2023-03-21 DIAGNOSIS — E1121 Type 2 diabetes mellitus with diabetic nephropathy: Secondary | ICD-10-CM | POA: Diagnosis not present

## 2023-03-21 DIAGNOSIS — Z0000 Encounter for general adult medical examination without abnormal findings: Secondary | ICD-10-CM | POA: Diagnosis not present

## 2023-03-21 DIAGNOSIS — I10 Essential (primary) hypertension: Secondary | ICD-10-CM | POA: Diagnosis not present

## 2023-03-31 DIAGNOSIS — D631 Anemia in chronic kidney disease: Secondary | ICD-10-CM | POA: Diagnosis not present

## 2023-03-31 DIAGNOSIS — N184 Chronic kidney disease, stage 4 (severe): Secondary | ICD-10-CM | POA: Diagnosis not present

## 2023-04-04 DIAGNOSIS — E1142 Type 2 diabetes mellitus with diabetic polyneuropathy: Secondary | ICD-10-CM | POA: Diagnosis not present

## 2023-04-04 DIAGNOSIS — L84 Corns and callosities: Secondary | ICD-10-CM | POA: Diagnosis not present

## 2023-04-04 DIAGNOSIS — N184 Chronic kidney disease, stage 4 (severe): Secondary | ICD-10-CM | POA: Diagnosis not present

## 2023-04-04 DIAGNOSIS — D631 Anemia in chronic kidney disease: Secondary | ICD-10-CM | POA: Diagnosis not present

## 2023-04-04 DIAGNOSIS — M19071 Primary osteoarthritis, right ankle and foot: Secondary | ICD-10-CM | POA: Diagnosis not present

## 2023-04-04 DIAGNOSIS — E1151 Type 2 diabetes mellitus with diabetic peripheral angiopathy without gangrene: Secondary | ICD-10-CM | POA: Diagnosis not present

## 2023-04-04 DIAGNOSIS — L603 Nail dystrophy: Secondary | ICD-10-CM | POA: Diagnosis not present

## 2023-04-04 DIAGNOSIS — L97212 Non-pressure chronic ulcer of right calf with fat layer exposed: Secondary | ICD-10-CM | POA: Diagnosis not present

## 2023-04-04 DIAGNOSIS — M19072 Primary osteoarthritis, left ankle and foot: Secondary | ICD-10-CM | POA: Diagnosis not present

## 2023-04-28 DIAGNOSIS — N184 Chronic kidney disease, stage 4 (severe): Secondary | ICD-10-CM | POA: Diagnosis not present

## 2023-05-02 DIAGNOSIS — D631 Anemia in chronic kidney disease: Secondary | ICD-10-CM | POA: Diagnosis not present

## 2023-05-02 DIAGNOSIS — N184 Chronic kidney disease, stage 4 (severe): Secondary | ICD-10-CM | POA: Diagnosis not present

## 2023-05-19 ENCOUNTER — Encounter (INDEPENDENT_AMBULATORY_CARE_PROVIDER_SITE_OTHER): Payer: Medicare Other | Admitting: Ophthalmology

## 2023-05-19 DIAGNOSIS — H33303 Unspecified retinal break, bilateral: Secondary | ICD-10-CM

## 2023-05-19 DIAGNOSIS — H35371 Puckering of macula, right eye: Secondary | ICD-10-CM

## 2023-05-19 DIAGNOSIS — H353111 Nonexudative age-related macular degeneration, right eye, early dry stage: Secondary | ICD-10-CM | POA: Diagnosis not present

## 2023-05-19 DIAGNOSIS — I10 Essential (primary) hypertension: Secondary | ICD-10-CM

## 2023-05-19 DIAGNOSIS — H35033 Hypertensive retinopathy, bilateral: Secondary | ICD-10-CM | POA: Diagnosis not present

## 2023-05-19 DIAGNOSIS — H353122 Nonexudative age-related macular degeneration, left eye, intermediate dry stage: Secondary | ICD-10-CM

## 2023-05-19 DIAGNOSIS — H43813 Vitreous degeneration, bilateral: Secondary | ICD-10-CM | POA: Diagnosis not present

## 2023-05-26 DIAGNOSIS — N184 Chronic kidney disease, stage 4 (severe): Secondary | ICD-10-CM | POA: Diagnosis not present

## 2023-05-26 DIAGNOSIS — I129 Hypertensive chronic kidney disease with stage 1 through stage 4 chronic kidney disease, or unspecified chronic kidney disease: Secondary | ICD-10-CM | POA: Diagnosis not present

## 2023-05-30 DIAGNOSIS — D631 Anemia in chronic kidney disease: Secondary | ICD-10-CM | POA: Diagnosis not present

## 2023-05-30 DIAGNOSIS — Z7984 Long term (current) use of oral hypoglycemic drugs: Secondary | ICD-10-CM | POA: Diagnosis not present

## 2023-05-30 DIAGNOSIS — Z7985 Long-term (current) use of injectable non-insulin antidiabetic drugs: Secondary | ICD-10-CM | POA: Diagnosis not present

## 2023-05-30 DIAGNOSIS — I129 Hypertensive chronic kidney disease with stage 1 through stage 4 chronic kidney disease, or unspecified chronic kidney disease: Secondary | ICD-10-CM | POA: Diagnosis not present

## 2023-05-30 DIAGNOSIS — E1122 Type 2 diabetes mellitus with diabetic chronic kidney disease: Secondary | ICD-10-CM | POA: Diagnosis not present

## 2023-05-30 DIAGNOSIS — N184 Chronic kidney disease, stage 4 (severe): Secondary | ICD-10-CM | POA: Diagnosis not present

## 2023-05-30 DIAGNOSIS — Z87891 Personal history of nicotine dependence: Secondary | ICD-10-CM | POA: Diagnosis not present

## 2023-06-12 DIAGNOSIS — M19071 Primary osteoarthritis, right ankle and foot: Secondary | ICD-10-CM | POA: Diagnosis not present

## 2023-06-12 DIAGNOSIS — L84 Corns and callosities: Secondary | ICD-10-CM | POA: Diagnosis not present

## 2023-06-12 DIAGNOSIS — M19072 Primary osteoarthritis, left ankle and foot: Secondary | ICD-10-CM | POA: Diagnosis not present

## 2023-06-12 DIAGNOSIS — E1151 Type 2 diabetes mellitus with diabetic peripheral angiopathy without gangrene: Secondary | ICD-10-CM | POA: Diagnosis not present

## 2023-06-12 DIAGNOSIS — E1142 Type 2 diabetes mellitus with diabetic polyneuropathy: Secondary | ICD-10-CM | POA: Diagnosis not present

## 2023-06-12 DIAGNOSIS — Z0001 Encounter for general adult medical examination with abnormal findings: Secondary | ICD-10-CM | POA: Diagnosis not present

## 2023-06-12 DIAGNOSIS — L603 Nail dystrophy: Secondary | ICD-10-CM | POA: Diagnosis not present

## 2023-06-12 DIAGNOSIS — L97212 Non-pressure chronic ulcer of right calf with fat layer exposed: Secondary | ICD-10-CM | POA: Diagnosis not present

## 2023-06-23 DIAGNOSIS — N184 Chronic kidney disease, stage 4 (severe): Secondary | ICD-10-CM | POA: Diagnosis not present

## 2023-06-23 DIAGNOSIS — D631 Anemia in chronic kidney disease: Secondary | ICD-10-CM | POA: Diagnosis not present

## 2023-06-27 DIAGNOSIS — N184 Chronic kidney disease, stage 4 (severe): Secondary | ICD-10-CM | POA: Diagnosis not present

## 2023-06-27 DIAGNOSIS — D631 Anemia in chronic kidney disease: Secondary | ICD-10-CM | POA: Diagnosis not present

## 2023-07-05 DIAGNOSIS — D649 Anemia, unspecified: Secondary | ICD-10-CM | POA: Diagnosis not present

## 2023-07-05 DIAGNOSIS — R195 Other fecal abnormalities: Secondary | ICD-10-CM | POA: Diagnosis not present

## 2023-07-24 DIAGNOSIS — N184 Chronic kidney disease, stage 4 (severe): Secondary | ICD-10-CM | POA: Diagnosis not present

## 2023-07-24 DIAGNOSIS — D631 Anemia in chronic kidney disease: Secondary | ICD-10-CM | POA: Diagnosis not present

## 2023-07-25 DIAGNOSIS — N184 Chronic kidney disease, stage 4 (severe): Secondary | ICD-10-CM | POA: Diagnosis not present

## 2023-07-25 DIAGNOSIS — D631 Anemia in chronic kidney disease: Secondary | ICD-10-CM | POA: Diagnosis not present

## 2023-08-07 DIAGNOSIS — K640 First degree hemorrhoids: Secondary | ICD-10-CM | POA: Diagnosis not present

## 2023-08-07 DIAGNOSIS — R195 Other fecal abnormalities: Secondary | ICD-10-CM | POA: Diagnosis not present

## 2023-08-07 DIAGNOSIS — D120 Benign neoplasm of cecum: Secondary | ICD-10-CM | POA: Diagnosis not present

## 2023-08-07 DIAGNOSIS — D128 Benign neoplasm of rectum: Secondary | ICD-10-CM | POA: Diagnosis not present

## 2023-08-07 DIAGNOSIS — D122 Benign neoplasm of ascending colon: Secondary | ICD-10-CM | POA: Diagnosis not present

## 2023-08-07 DIAGNOSIS — K5140 Inflammatory polyps of colon without complications: Secondary | ICD-10-CM | POA: Diagnosis not present

## 2023-08-07 DIAGNOSIS — D124 Benign neoplasm of descending colon: Secondary | ICD-10-CM | POA: Diagnosis not present

## 2023-08-07 DIAGNOSIS — K5730 Diverticulosis of large intestine without perforation or abscess without bleeding: Secondary | ICD-10-CM | POA: Diagnosis not present

## 2023-08-16 DIAGNOSIS — L84 Corns and callosities: Secondary | ICD-10-CM | POA: Diagnosis not present

## 2023-08-16 DIAGNOSIS — E1151 Type 2 diabetes mellitus with diabetic peripheral angiopathy without gangrene: Secondary | ICD-10-CM | POA: Diagnosis not present

## 2023-08-16 DIAGNOSIS — E1142 Type 2 diabetes mellitus with diabetic polyneuropathy: Secondary | ICD-10-CM | POA: Diagnosis not present

## 2023-08-16 DIAGNOSIS — M19072 Primary osteoarthritis, left ankle and foot: Secondary | ICD-10-CM | POA: Diagnosis not present

## 2023-08-16 DIAGNOSIS — M19071 Primary osteoarthritis, right ankle and foot: Secondary | ICD-10-CM | POA: Diagnosis not present

## 2023-08-16 DIAGNOSIS — L97212 Non-pressure chronic ulcer of right calf with fat layer exposed: Secondary | ICD-10-CM | POA: Diagnosis not present

## 2023-08-16 DIAGNOSIS — L603 Nail dystrophy: Secondary | ICD-10-CM | POA: Diagnosis not present

## 2023-08-17 DIAGNOSIS — D631 Anemia in chronic kidney disease: Secondary | ICD-10-CM | POA: Diagnosis not present

## 2023-08-17 DIAGNOSIS — N184 Chronic kidney disease, stage 4 (severe): Secondary | ICD-10-CM | POA: Diagnosis not present

## 2023-08-22 DIAGNOSIS — N184 Chronic kidney disease, stage 4 (severe): Secondary | ICD-10-CM | POA: Diagnosis not present

## 2023-08-22 DIAGNOSIS — D631 Anemia in chronic kidney disease: Secondary | ICD-10-CM | POA: Diagnosis not present

## 2023-08-24 DIAGNOSIS — M19072 Primary osteoarthritis, left ankle and foot: Secondary | ICD-10-CM | POA: Diagnosis not present

## 2023-08-24 DIAGNOSIS — L603 Nail dystrophy: Secondary | ICD-10-CM | POA: Diagnosis not present

## 2023-08-24 DIAGNOSIS — M19071 Primary osteoarthritis, right ankle and foot: Secondary | ICD-10-CM | POA: Diagnosis not present

## 2023-08-24 DIAGNOSIS — L97212 Non-pressure chronic ulcer of right calf with fat layer exposed: Secondary | ICD-10-CM | POA: Diagnosis not present

## 2023-08-24 DIAGNOSIS — E1151 Type 2 diabetes mellitus with diabetic peripheral angiopathy without gangrene: Secondary | ICD-10-CM | POA: Diagnosis not present

## 2023-08-24 DIAGNOSIS — L84 Corns and callosities: Secondary | ICD-10-CM | POA: Diagnosis not present

## 2023-08-24 DIAGNOSIS — E1142 Type 2 diabetes mellitus with diabetic polyneuropathy: Secondary | ICD-10-CM | POA: Diagnosis not present

## 2023-09-01 DIAGNOSIS — L84 Corns and callosities: Secondary | ICD-10-CM | POA: Diagnosis not present

## 2023-09-01 DIAGNOSIS — M19071 Primary osteoarthritis, right ankle and foot: Secondary | ICD-10-CM | POA: Diagnosis not present

## 2023-09-01 DIAGNOSIS — E1151 Type 2 diabetes mellitus with diabetic peripheral angiopathy without gangrene: Secondary | ICD-10-CM | POA: Diagnosis not present

## 2023-09-01 DIAGNOSIS — E1142 Type 2 diabetes mellitus with diabetic polyneuropathy: Secondary | ICD-10-CM | POA: Diagnosis not present

## 2023-09-01 DIAGNOSIS — M19072 Primary osteoarthritis, left ankle and foot: Secondary | ICD-10-CM | POA: Diagnosis not present

## 2023-09-01 DIAGNOSIS — L603 Nail dystrophy: Secondary | ICD-10-CM | POA: Diagnosis not present

## 2023-09-01 DIAGNOSIS — L97212 Non-pressure chronic ulcer of right calf with fat layer exposed: Secondary | ICD-10-CM | POA: Diagnosis not present

## 2023-09-15 DIAGNOSIS — N184 Chronic kidney disease, stage 4 (severe): Secondary | ICD-10-CM | POA: Diagnosis not present

## 2023-09-15 DIAGNOSIS — D631 Anemia in chronic kidney disease: Secondary | ICD-10-CM | POA: Diagnosis not present

## 2023-09-19 DIAGNOSIS — D631 Anemia in chronic kidney disease: Secondary | ICD-10-CM | POA: Diagnosis not present

## 2023-09-19 DIAGNOSIS — N184 Chronic kidney disease, stage 4 (severe): Secondary | ICD-10-CM | POA: Diagnosis not present

## 2023-10-02 DIAGNOSIS — N182 Chronic kidney disease, stage 2 (mild): Secondary | ICD-10-CM | POA: Diagnosis not present

## 2023-10-02 DIAGNOSIS — E1152 Type 2 diabetes mellitus with diabetic peripheral angiopathy with gangrene: Secondary | ICD-10-CM | POA: Diagnosis not present

## 2023-10-02 DIAGNOSIS — Z79899 Other long term (current) drug therapy: Secondary | ICD-10-CM | POA: Diagnosis not present

## 2023-10-03 DIAGNOSIS — I10 Essential (primary) hypertension: Secondary | ICD-10-CM | POA: Diagnosis not present

## 2023-10-03 DIAGNOSIS — L129 Pemphigoid, unspecified: Secondary | ICD-10-CM | POA: Diagnosis not present

## 2023-10-03 DIAGNOSIS — D631 Anemia in chronic kidney disease: Secondary | ICD-10-CM | POA: Diagnosis not present

## 2023-10-03 DIAGNOSIS — N184 Chronic kidney disease, stage 4 (severe): Secondary | ICD-10-CM | POA: Diagnosis not present

## 2023-10-03 DIAGNOSIS — E1121 Type 2 diabetes mellitus with diabetic nephropathy: Secondary | ICD-10-CM | POA: Diagnosis not present

## 2023-10-03 DIAGNOSIS — E7800 Pure hypercholesterolemia, unspecified: Secondary | ICD-10-CM | POA: Diagnosis not present

## 2023-10-07 DIAGNOSIS — Z23 Encounter for immunization: Secondary | ICD-10-CM | POA: Diagnosis not present

## 2023-10-17 DIAGNOSIS — D631 Anemia in chronic kidney disease: Secondary | ICD-10-CM | POA: Diagnosis not present

## 2023-10-17 DIAGNOSIS — E1122 Type 2 diabetes mellitus with diabetic chronic kidney disease: Secondary | ICD-10-CM | POA: Diagnosis not present

## 2023-10-17 DIAGNOSIS — I129 Hypertensive chronic kidney disease with stage 1 through stage 4 chronic kidney disease, or unspecified chronic kidney disease: Secondary | ICD-10-CM | POA: Diagnosis not present

## 2023-10-17 DIAGNOSIS — N184 Chronic kidney disease, stage 4 (severe): Secondary | ICD-10-CM | POA: Diagnosis not present

## 2023-10-20 DIAGNOSIS — E1142 Type 2 diabetes mellitus with diabetic polyneuropathy: Secondary | ICD-10-CM | POA: Diagnosis not present

## 2023-10-20 DIAGNOSIS — E1151 Type 2 diabetes mellitus with diabetic peripheral angiopathy without gangrene: Secondary | ICD-10-CM | POA: Diagnosis not present

## 2023-10-20 DIAGNOSIS — L603 Nail dystrophy: Secondary | ICD-10-CM | POA: Diagnosis not present

## 2023-10-20 DIAGNOSIS — M19072 Primary osteoarthritis, left ankle and foot: Secondary | ICD-10-CM | POA: Diagnosis not present

## 2023-10-20 DIAGNOSIS — M19071 Primary osteoarthritis, right ankle and foot: Secondary | ICD-10-CM | POA: Diagnosis not present

## 2023-10-20 DIAGNOSIS — L97212 Non-pressure chronic ulcer of right calf with fat layer exposed: Secondary | ICD-10-CM | POA: Diagnosis not present

## 2023-10-20 DIAGNOSIS — L84 Corns and callosities: Secondary | ICD-10-CM | POA: Diagnosis not present

## 2023-11-10 DIAGNOSIS — D631 Anemia in chronic kidney disease: Secondary | ICD-10-CM | POA: Diagnosis not present

## 2023-11-10 DIAGNOSIS — N184 Chronic kidney disease, stage 4 (severe): Secondary | ICD-10-CM | POA: Diagnosis not present

## 2023-11-14 DIAGNOSIS — N184 Chronic kidney disease, stage 4 (severe): Secondary | ICD-10-CM | POA: Diagnosis not present

## 2023-11-14 DIAGNOSIS — D631 Anemia in chronic kidney disease: Secondary | ICD-10-CM | POA: Diagnosis not present

## 2023-12-08 DIAGNOSIS — L814 Other melanin hyperpigmentation: Secondary | ICD-10-CM | POA: Diagnosis not present

## 2023-12-08 DIAGNOSIS — L820 Inflamed seborrheic keratosis: Secondary | ICD-10-CM | POA: Diagnosis not present

## 2023-12-08 DIAGNOSIS — L7211 Pilar cyst: Secondary | ICD-10-CM | POA: Diagnosis not present

## 2023-12-08 DIAGNOSIS — L120 Bullous pemphigoid: Secondary | ICD-10-CM | POA: Diagnosis not present

## 2023-12-08 DIAGNOSIS — D225 Melanocytic nevi of trunk: Secondary | ICD-10-CM | POA: Diagnosis not present

## 2023-12-08 DIAGNOSIS — D631 Anemia in chronic kidney disease: Secondary | ICD-10-CM | POA: Diagnosis not present

## 2023-12-08 DIAGNOSIS — L308 Other specified dermatitis: Secondary | ICD-10-CM | POA: Diagnosis not present

## 2023-12-08 DIAGNOSIS — L111 Transient acantholytic dermatosis [Grover]: Secondary | ICD-10-CM | POA: Diagnosis not present

## 2023-12-08 DIAGNOSIS — N184 Chronic kidney disease, stage 4 (severe): Secondary | ICD-10-CM | POA: Diagnosis not present

## 2023-12-08 DIAGNOSIS — L821 Other seborrheic keratosis: Secondary | ICD-10-CM | POA: Diagnosis not present

## 2023-12-12 DIAGNOSIS — D631 Anemia in chronic kidney disease: Secondary | ICD-10-CM | POA: Diagnosis not present

## 2023-12-12 DIAGNOSIS — N184 Chronic kidney disease, stage 4 (severe): Secondary | ICD-10-CM | POA: Diagnosis not present

## 2024-05-24 ENCOUNTER — Encounter (INDEPENDENT_AMBULATORY_CARE_PROVIDER_SITE_OTHER): Admitting: Ophthalmology

## 2024-05-31 ENCOUNTER — Encounter (INDEPENDENT_AMBULATORY_CARE_PROVIDER_SITE_OTHER): Admitting: Ophthalmology
